# Patient Record
Sex: Female | Born: 1970 | Hispanic: No | Marital: Married | Smoking: Light tobacco smoker
Health system: Southern US, Community
[De-identification: ages and names within clinical notes are randomized; demographics above are authoritative.]

## PROBLEM LIST (undated history)

## (undated) DIAGNOSIS — I739 Peripheral vascular disease, unspecified: Secondary | ICD-10-CM

## (undated) DIAGNOSIS — M1990 Unspecified osteoarthritis, unspecified site: Secondary | ICD-10-CM

## (undated) DIAGNOSIS — R5383 Other fatigue: Secondary | ICD-10-CM

## (undated) DIAGNOSIS — D649 Anemia, unspecified: Secondary | ICD-10-CM

## (undated) DIAGNOSIS — M940 Chondrocostal junction syndrome [Tietze]: Secondary | ICD-10-CM

## (undated) DIAGNOSIS — K219 Gastro-esophageal reflux disease without esophagitis: Secondary | ICD-10-CM

## (undated) DIAGNOSIS — O24419 Gestational diabetes mellitus in pregnancy, unspecified control: Secondary | ICD-10-CM

## (undated) DIAGNOSIS — N644 Mastodynia: Secondary | ICD-10-CM

## (undated) DIAGNOSIS — E785 Hyperlipidemia, unspecified: Secondary | ICD-10-CM

## (undated) DIAGNOSIS — Z720 Tobacco use: Secondary | ICD-10-CM

## (undated) DIAGNOSIS — I10 Essential (primary) hypertension: Secondary | ICD-10-CM

## (undated) HISTORY — DX: Hyperlipidemia, unspecified: E78.5

## (undated) HISTORY — DX: Gestational diabetes mellitus in pregnancy, unspecified control: O24.419

## (undated) HISTORY — DX: Chondrocostal junction syndrome (tietze): M94.0

## (undated) HISTORY — DX: Mastodynia: N64.4

## (undated) HISTORY — DX: Essential (primary) hypertension: I10

## (undated) HISTORY — DX: Gastro-esophageal reflux disease without esophagitis: K21.9

## (undated) HISTORY — DX: Anemia, unspecified: D64.9

## (undated) HISTORY — DX: Unspecified osteoarthritis, unspecified site: M19.90

## (undated) HISTORY — DX: Other fatigue: R53.83

## (undated) HISTORY — DX: Tobacco use: Z72.0

## (undated) HISTORY — DX: Peripheral vascular disease, unspecified: I73.9

---

## 1998-05-13 ENCOUNTER — Other Ambulatory Visit: Admission: RE | Admit: 1998-05-13 | Discharge: 1998-05-13 | Payer: Self-pay | Admitting: *Deleted

## 1998-06-29 HISTORY — PX: TUBAL LIGATION: SHX77

## 1999-06-11 ENCOUNTER — Other Ambulatory Visit: Admission: RE | Admit: 1999-06-11 | Discharge: 1999-06-11 | Payer: Self-pay | Admitting: Obstetrics and Gynecology

## 2000-08-18 ENCOUNTER — Other Ambulatory Visit: Admission: RE | Admit: 2000-08-18 | Discharge: 2000-08-18 | Payer: Self-pay | Admitting: Obstetrics and Gynecology

## 2002-05-23 ENCOUNTER — Other Ambulatory Visit: Admission: RE | Admit: 2002-05-23 | Discharge: 2002-05-23 | Payer: Self-pay | Admitting: Obstetrics and Gynecology

## 2003-05-07 ENCOUNTER — Inpatient Hospital Stay (HOSPITAL_COMMUNITY): Admission: AD | Admit: 2003-05-07 | Discharge: 2003-05-07 | Payer: Self-pay | Admitting: Obstetrics and Gynecology

## 2003-06-07 ENCOUNTER — Inpatient Hospital Stay (HOSPITAL_COMMUNITY): Admission: AD | Admit: 2003-06-07 | Discharge: 2003-06-08 | Payer: Self-pay | Admitting: Obstetrics and Gynecology

## 2003-08-27 ENCOUNTER — Other Ambulatory Visit: Admission: RE | Admit: 2003-08-27 | Discharge: 2003-08-27 | Payer: Self-pay | Admitting: Obstetrics and Gynecology

## 2004-10-01 ENCOUNTER — Other Ambulatory Visit: Admission: RE | Admit: 2004-10-01 | Discharge: 2004-10-01 | Payer: Self-pay | Admitting: Obstetrics and Gynecology

## 2007-06-30 DIAGNOSIS — O24419 Gestational diabetes mellitus in pregnancy, unspecified control: Secondary | ICD-10-CM

## 2007-06-30 DIAGNOSIS — I10 Essential (primary) hypertension: Secondary | ICD-10-CM

## 2007-06-30 HISTORY — DX: Gestational diabetes mellitus in pregnancy, unspecified control: O24.419

## 2007-06-30 HISTORY — PX: LASIK: SHX215

## 2007-06-30 HISTORY — DX: Essential (primary) hypertension: I10

## 2007-11-30 ENCOUNTER — Encounter: Admission: RE | Admit: 2007-11-30 | Discharge: 2007-11-30 | Payer: Self-pay | Admitting: Obstetrics and Gynecology

## 2008-04-06 ENCOUNTER — Inpatient Hospital Stay (HOSPITAL_COMMUNITY): Admission: AD | Admit: 2008-04-06 | Discharge: 2008-04-08 | Payer: Self-pay | Admitting: Obstetrics and Gynecology

## 2008-04-06 ENCOUNTER — Encounter (INDEPENDENT_AMBULATORY_CARE_PROVIDER_SITE_OTHER): Payer: Self-pay | Admitting: Obstetrics and Gynecology

## 2008-05-16 ENCOUNTER — Ambulatory Visit (HOSPITAL_COMMUNITY): Admission: RE | Admit: 2008-05-16 | Discharge: 2008-05-16 | Payer: Self-pay | Admitting: Obstetrics and Gynecology

## 2010-08-02 ENCOUNTER — Emergency Department (HOSPITAL_COMMUNITY): Payer: Self-pay

## 2010-08-02 ENCOUNTER — Emergency Department (HOSPITAL_COMMUNITY)
Admission: EM | Admit: 2010-08-02 | Discharge: 2010-08-02 | Disposition: A | Discharge status: Home / Self Care | Payer: Self-pay | Attending: Emergency Medicine | Admitting: Emergency Medicine

## 2010-08-02 DIAGNOSIS — R209 Unspecified disturbances of skin sensation: Secondary | ICD-10-CM | POA: Insufficient documentation

## 2010-08-02 DIAGNOSIS — R51 Headache: Secondary | ICD-10-CM

## 2010-08-02 DIAGNOSIS — G43909 Migraine, unspecified, not intractable, without status migrainosus: Secondary | ICD-10-CM | POA: Insufficient documentation

## 2010-11-11 NOTE — Op Note (Signed)
NAMESALLE, Krystal Duran               ACCOUNT NO.:  [id number]   MEDICAL RECORD NO.:  [id number]          PATIENT TYPE:  INP   LOCATION:  9168                          FACILITY:  [facility]   PHYSICIAN:  Janine Limbo, M.D.DATE OF BIRTH:  12/16/1971   DATE OF PROCEDURE:  DATE OF DISCHARGE:                               OPERATIVE REPORT   PREOPERATIVE DIAGNOSES:  1. A 38 and 1/[redacted] weeks gestation.  2. Preeclampsia.  3. Polyhydramnios.  4. Suspected macrosomia.  5. Gestational diabetes mellitus currently treated with glyburide.   POSTOPERATIVE DIAGNOSES:  1. A 38 and 1/[redacted] weeks gestation.  2. Preeclampsia.  3. Polyhydramnios.  4. Not suspected macrosomia.  5. Gestational diabetes mellitus currently treated with glyburide.  6. Nuchal cord x2.  7. Shoulder dystocia.  8. Second-degree midline laceration.   PROCEDURES:  1. Normal spontaneous vaginal delivery.  2. Management of shoulder dystocia.  3. Repair of second-degree midline laceration.   OBSTETRICIAN:  Janine Limbo, MD   ANESTHETIC:  Epidural and local 1% Xylocaine.   DISPOSITION:  Krystal Duran is a 40 year old female, gravida 5, para 4-0-0-  0, who presents at 53 and 1/[redacted] weeks gestation.  The patient has been  followed at the [facility] of  [facility].  This pregnancy has been complicated by  the fact that she has gestational diabetes and she is being treated with  glyburide.  An ultrasound has shown polyhydramnios and suspected it  macrosomia.  The patient presents with an elevated blood pressure and  with 447 mg of protein over a 24-hour sample.  She has been diagnosed  with preeclampsia.  She was admitted to the hospital today for induction  of labor.  She was started on magnesium and then started on Pitocin for  induction.  Her membranes were ruptured.  The patient quickly dilated  her cervix and was ready to push for delivery.   FINDINGS:  A 10 pound 3  ounce female infant Financial controller) was delivered from an  occiput anterior presentation.  There were 2 nuchal cords present.  A  shoulder dystocia was encountered.  The Apgars were 5 at 1 minute and 8  at 5 minutes.  The arterial cord blood pH was 7.18.  There was a second-  degree midline laceration.  There were no upper vaginal or cervical  lacerations present.   PROCEDURE:  The patient was placed in a lithotomy position.  She was  draped for delivery.  The patient was able to deliver the fetal head  without difficulty.  The mouth and nose were suctioned.  Two nuchal  cords were present and these were reduced with the head on the perineum.  The patient was allowed to push and the anterior shoulder did not  deliver.  Suprapubic pressure was applied and the patient's hips were  extended to their maximum width.  Again, the anterior shoulder did not  deliver.  We were then able to rotate the fetal shoulder from under the  symphysis and the patient was able to deliver the infant at this point  without difficulty.  The cord was clamped and cut.  The infant was  handed to the pediatric resuscitation team who were present.  An  arterial cord blood pH was obtained.  Routine cord blood studies were  then obtained.  The placenta was removed.  The perineum, upper vagina,  and the cervix were carefully inspected. 5 mL of 1% Xylocaine were  injected into the second-degree midline laceration.  The laceration was  closed using a running locking suture of 2-0 Vicryl.  The infant was  carefully evaluated by the Pediatric Team and grunting was noted.  The  decision was made to transfer the infant to the neonatal intensive care  nursery for close observation.  Krystal Duran was returned to the supine  position.  She will be observed on labor and delivery and then taken to  the adult intensive care unit where she will continue on 2 g of  magnesium sulfate intravenously for total of 24 hours.  The placenta was  sent  to pathology for evaluation.  The estimated blood loss for the  procedure was 400 mL.      Janine Limbo, M.D.  Electronically Signed     AVS/MEDQ  D:  04/06/2008  T:  04/07/2008  Job:  [id number]

## 2010-11-11 NOTE — H&P (Signed)
[name], [name]               ACCOUNT NO.:  [id number]   MEDICAL RECORD NO.:  [id number]          PATIENT TYPE:  MAT   LOCATION:  MATC                          [facility]ILITY:  [facility]   PHYSICIAN:  Janine Limbo, M.D.DATE OF BIRTH:  12/16/1971   DATE OF ADMISSION:  04/06/2008  DATE OF DISCHARGE:                              HISTORY & PHYSICAL   HISTORY OF PRESENT ILLNESS:  [name] is a 40 year old gravida 5,  para 4-0-0-4 at 38-2/7 weeks who presented from the office for blood  pressure evaluation.  Blood pressure in the office today was 110/89 with  positive headache and 1+ protein on a voided specimen with a nonreactive  NST there.  She does report positive visual spots.  She did bring a 24-  hour urine for evaluation.  Cervix has been 3 cm, 80% in the office.   While in maternity admissions unit, the patient was noted to be having  contractions approximately every 3-4 minutes.  Cervix was 3, 75% vertex  and a -2 station.  In light of the patient's elevation of blood  pressure, positive symptomatology, positive urine protein and  contraction activity, the decision was made to admit her for further  labor care and augmentation of labor.  Pregnancy has been remarkable  for:  1. Grand multiparous.  2. Advanced maternal age with amnio declined and all genetic screening      declined.  3. Gestational diabetes with patient on glyburide 5 mg p.o. daily.  4. PIH, no medication required at present.  5. Arcuate uterus.  6. LGA infant.  7. Polyhydramnios.  8. 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   PRENATAL LABORATORIES:  Blood type is O+, Rh antibody negative, VDRL  nonreactive, rubella titer positive, hepatitis B surface antigen  negative, HIV was nonreactive, GC and chlamydia cultures were negative  in March.  Hemoglobin upon entering the practice was 11.8, it was within  normal limits at 28 weeks.  The patient declined first trimester  screening.  She  had an elevated 1-hour Glucola at 19 weeks and then had  an abnormal 3-hour GTT at which time at 19 weeks.  She was diagnosed  with gestational diabetes.  PIH labs were done at 19 weeks secondary to  mild elevation of her blood pressure.  These were negative.  Her CBGs  remained in reasonable control with glyburide up to 10 mg up until the  last week or so when her blood sugars were down.  She had a negative  group B strep culture at 36 weeks.   HISTORY OF PRESENT PREGNANCY:  The patient entered care at approximately  10 weeks by ultrasound at that time, giving an [facility] of April 18, 2008.  She did have a placenta previa noted at that time and a right corpus  luteum cyst.  She was also having issues with nausea.  She was placed on  Zofran.  At 14 weeks, she had a blood pressures 152/80.  No medication  was required at that time.  At 15 weeks, she had a one hour Glucola, it  was elevated, 3-hour GTT was also abnormal, and she received the  diagnosis of gestational diabetes.  She had another ultrasound at 19  weeks, still showing a posterior low-lying placenta.  Cervix was within  normal limits.  Blood pressure at that time was 150/90 and 140/75.  PIH  labs were done at that time that were normal.  Glyburide 5 mg was  started with her diagnosis of gestational diabetes, and then at 25 weeks  was up to 10 mg with dinner.  CBGs were normal.  At 29 weeks, her  ultrasound still showed a low-lying placenta, but weight was at the 93rd  percentile.  NSTs were begun at 31 weeks twice weekly, at 33-1/2 weeks  growth was at greater than 95th percentile, normal fluid.  It was at  85th percentile and BPP was 8/8.  Posterior placenta was still low  lying.  On September 14.  She did have a 4+ glycosuria the cervix at 35  weeks was 1 cm.  On October 6,  blood pressure was 130/94, cervix was 3,  80%.  Ultrasound showed normal placental location of polyhydramnios and  estimated fetal weight of 9 pounds 4  ounces.  She had 1+ protein on a  voided specimen.  The plan was made for 24-hour urine.  PIH labs were  also done that day.  She was then brought back today to return a 24-hour  urine.  Blood pressure was 140/86.  She did complain of a migraine.  She  was also having some questionable spots in her vision.  She was sent to  maternity admissions unit for evaluation, and at that time was admitted  to the hospital for care.  PIH labs at that time on 10/06 showed a  platelet count of 182, SGOT and SGPT of 15 and less than eight with  everything else completely normal.  The rest of her pregnancy has been  followed in the same way.   OBSTETRICAL HISTORY:  In 1993, she had a vaginal delivery of female  infant weight 7 pounds 5 ounces at 40 weeks.  She was in labor 10 hours.  She had no anesthesia.  No complications.  In 1995, she had a vaginal  birth of a female infant weight 7 pounds 11 ounces at 40 weeks.  She was  in labor 4 hours.  She had no complications, and no anesthesia.  In  1997, she had a vaginal birth of a female infant, weight 8 pounds 15  ounces at 40 weeks.  She was in labor 3 hours.  She had no complications  and no anesthesia.  In 2004, she had a vaginal birth of a female infant,  weight 8 pounds 15 ounces at 40 weeks.  She was in labor 2-3 hours.  She  had no complications and no anesthesia during that pregnancy she did  have gestational diabetes diet controlled, and pregnancy induced  hypertension.  She also was noted to have an arcuate or heart shaped  uterus.   MEDICAL HISTORY:  She reports usual childhood illnesses.  She also  reports migraines.   SURGICAL HISTORY:  Lasix eye surgery in 2006.   ALLERGIES:  She has a sensitivity to Motrin which causes stomach pain.   FAMILY HISTORY:  Maternal aunt has heart disease.  Mother and maternal  aunt have hypertension.  Her cousins have asthma.  Her husband who is  her first cousin has non-insulin dependent diabetes.  Maternal  sister  and cousin all  have migraines.  Her cousin had vaginal cancer.  The  patient's only other hospitalizations were for childbirth.  Genetic  history is remarkable the patient's age of 12.  Father of baby's age of  72.  The patient's father of baby are first cousin and the patient's  sister and cousin had twins.   SOCIAL HISTORY:  The patient is married to the father of baby.  He is  involved and supportive.  His name is [facility]  The patient is  Arabic, from [place].  She is of the Muslim faith.  She is high school  educated.  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  Her  husband is college educated.  He is also a Sports administrator.  She has  been followed by the physician service [facility].  She denies  any alcohol, drug or tobacco use during this pregnancy.   PHYSICAL EXAMINATION:  VITAL SIGNS:  Blood pressure is 154/89 and  153/88.  Other vital signs are stable.  The patient is afebrile.  HEENT:  Within normal limits.  LUNGS:  Breath sounds are clear.  HEART:  Regular rate and rhythm without murmur.  BREASTS:  Soft and nontender.  ABDOMEN:  Fundal height is approximately 39-40 cm.  Estimated fetal  weight is 9 pounds.  Uterine contractions every 3-4 minutes, 60 seconds  in duration, mild quality.  NST is reactive with no decelerations, and  there is a negative spontaneous CST and segments.  Cervix is 3, 75%  soft, vertex, -2 station.  Deep tendon reflexes are 1+ without clonus.  There is trace edema noted.   IMPRESSION:  1. Intrauterine pregnancy at 38-2/7 weeks.  2. Pregnancy-induced hypertension.  3. Gestational diabetes.  4. Favorable cervix  5. Group B strep negative.   PLAN:  1. Admit to birthing suite for consult with Dr. Stefano Gaul as attending      physician.  2. Routine physician orders.  3. PIH labs and 24-hour urine results are currently pending.  4. The patient declines pain medication.      Renaldo Reel Emilee Hero,  C.N.M.      Janine Limbo, M.D.  Electronically Signed    VLL/MEDQ  D:  04/06/2008  T:  04/06/2008  Job:  [id number]

## 2010-11-11 NOTE — Op Note (Signed)
Krystal Duran, Krystal Duran               ACCOUNT NO.:  [id number]   MEDICAL RECORD NO.:  [id number]          PATIENT TYPE:  AMB   LOCATION:  SDC                           [facility]   PHYSICIAN:  Hal Morales, M.D.DATE OF BIRTH:  12/16/1971   DATE OF PROCEDURE:  05/16/2008  DATE OF DISCHARGE:                               OPERATIVE REPORT   PREOPERATIVE DIAGNOSIS:  Desire for surgical sterilization.   POSTOPERATIVE DIAGNOSIS:  Desire for surgical sterilization.   OPERATION:  Laparoscopic tubal cautery.   SURGEON:  Vanessa P. Haygood, MD   ANESTHESIA:  General orotracheal.   ESTIMATED BLOOD LOSS:  Less than 10 mL.   COMPLICATIONS:  None.   FINDINGS:  Uterus, tubes, and ovaries appeared within normal limits.   PROCEDURE:  The patient was taken to the operating room after  appropriate identification and placed on the operating table.  After the  attainment of adequate general anesthesia, she was placed in the  modified lithotomy position.  The abdomen, perineum, and vagina were  prepped with multiple layers of Betadine and an in-and-out catheter used  to empty the bladder.  Single-tooth tenaculum was placed on the anterior  cervix.  The abdomen was then draped as a sterile field.  Subumbilical  injection of 0.25% Marcaine for a total of 10 mL was undertaken.  A  subumbilical incision was made and the Veress cannula placed through  that incision into the peritoneal cavity.  Pneumoperitoneum was created  with 3.5 liters of CO2.  Veress cannula was removed and laparoscopic  trocar placed through that incision into the peritoneal cavity.  The  laparoscope was placed through the trocar sleeve.  The above noted  findings were made.  The bipolar cautery was then placed through the  operating channel of the laparoscope.  The right fallopian tube was  identified, followed to its fimbriated end, then grasped at the isthmic  portion and elevated.  The tube was then cauterized in two  adjacent  areas.  A similar procedure was carried out on the opposite side.  Hemostasis was noted to be adequate.  All instruments were then removed  from the peritoneal cavity under direct visualization as CO2 was allowed  to escape.  A fascial suture of 0 Vicryl was placed in a figure-of-eight  fashion.  Subcuticular suture of 3-0 Vicryl was used to close the skin  incision.  Sterile dressing was applied.  The single-tooth tenaculum was  removed and hemostasis achieved with silver nitrate on the cervix.  The  patient was awakened from general anesthesia and taken to the recovery  room in satisfactory condition having tolerated the procedure well with  sponge and instrument counts correct.   DISCHARGE INSTRUCTIONS:  Printed instructions for laparoscopy from the  [facility] of [place].   DISCHARGE MEDICATIONS:  Vicodin 1-2 p.o. q.4 h. p.r.n. pain.   FOLLOWUP:  The patient is to follow up in 2 weeks with Dr. Pennie Rushing.      Hal Morales, M.D.  Electronically Signed     VPH/MEDQ  D:  05/16/2008  T:  05/16/2008  Job:  [id number] 

## 2010-11-14 NOTE — H&P (Signed)
NAMEKYNSIE, FALKNER                           ACCOUNT NO.:  [id number]   MEDICAL RECORD NO.:  [id number]                   PATIENT TYPE:  INP   LOCATION:  9173                                 [facility]   PHYSICIAN:  Naima A. Dillard, M.D.              DATE OF BIRTH:  12/16/1971   DATE OF ADMISSION:  06/07/2003  DATE OF DISCHARGE:                                HISTORY & PHYSICAL   HISTORY:  Ms. Bywater is a 40 year old married white female gravida 3, para  3-0-0-3 at 39-6/7 weeks who presents with questionable leaking clear fluid  since 11 p.m. on December 8.  She reports regular uterine contractions since  that time.  She denies headache, nausea, vomiting, visual disturbances.  Denies bleeding.  Her pregnancy has been followed by the [facility]  OB/GYN service and has been remarkable for history of rapid labor, history  of postpartum depression, migraines, arcuate uterus, prefers female  Raymir Frommelt, group B Strep negative.   PRENATAL LABORATORIES:  Her prenatal laboratories were collected on Nov 15, 2002.  Hemoglobin 12.6, hematocrit 37.1, platelets 268,000.  Blood type O+.  Antibody negative.  Sickle cell trait negative.  RPR nonreactive.  Rubella  immune.  Hepatitis B surface antigen negative.  Pap smear from November 2003  was within normal limits.  Gonorrhea negative.  Chlamydia negative.  Her one-  hour Glucola from March 09, 2003 was 156.  Her RPR at that time was  nonreactive.  Fetal fibronectin was negative.  Her three-hour GTT from  March 20, 2003 was within normal limits at 99, 180, 147, and 123.  Culture of the vaginal tract for group B Strep, gonorrhea, and Chlamydia on  May 07, 2003 were all negative.   HISTORY OF PRESENT PREGNANCY:  She presented for care at [facility] on  Nov 14, 2002 at [redacted] weeks gestation.  Pregnancy ultrasonography at [redacted] weeks  gestation revealed left ovarian cyst, simple.  Second pregnancy  ultrasonography at [redacted] weeks  gestation showed growth consistent with previous  dating and a normal anatomy.  By [redacted] weeks gestation she continued to have  pelvic pressure and fetal fibronectin was collected which was negative.  At  [redacted] weeks gestation she was having some transient blood pressure increases  and was put on a level I bed rest and at [redacted] weeks gestation she had another  ultrasound to evaluate growth which was at the 72nd percentile.  The rest of  her prenatal care was unremarkable.   PAST OBSTETRICAL HISTORY:  She is a gravida 4, para 3-0-0-3.  In August of  1993 she had a vaginal delivery of a female infant weighing 7 pounds 5  ounces at [redacted] weeks gestation after 11 hours in labor.  She had no  complications.  Infant's name was Arabella Merles.  In October of 1995 she had a  vaginal delivery of a female infant weighing 8 pounds 4  ounces at 39+ weeks  gestation after three hours of labor.  There were no complications.  His  name was Husseim.  In July of 1997 she had a vaginal delivery of another  female infant weighing 8 pounds 14 ounces at 39+ weeks gestation after two  hours in labor.  She had no complications.  His name was [place].  She  reports being anemic with her third pregnancy as well as having the baby  blues with each pregnancy.   ALLERGIES:  She has no medication allergies, although reports stomach upset  with Motrin.   PAST MEDICAL HISTORY:  1. She has used oral contraceptives which she stopped in June of 2004.  2. She has an arcuate uterus.  3. She has an occasional yeast infection.  4. She reports having had the usual childhood illnesses.  5. She has a questionable stomach ulcer secondary to Motrin use.  6. She has a history of migraines for which she takes Tylenol.   FAMILY HISTORY:  Remarkable for other family members with migraines and a  cousin with unknown type of cancer.   GENETIC HISTORY:  She has a first cousin with possible anomalies secondary  to questionable polyhydramnios.  Father of the  baby is 86 years old.   SOCIAL HISTORY:  The patient is married to the father of the baby.  His name  is Systems analyst.  They are of the Muslim faith from [place].  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  Father of the baby is college educated and is  employed as an Acupuncturist.  They deny any alcohol, tobacco, or  illicit drug use with the pregnancy.   PHYSICAL EXAMINATION:  VITAL SIGNS:  Stable.  She is afebrile.  HEENT:  Grossly within normal limits.  CHEST:  Clear to auscultation.  HEART:  Regular rate and rhythm.  ABDOMEN:  Gravid in contour with fundal height extending approximately 40 cm  above the pubic symphysis.  Electronic fetal monitoring shows a reactive and  reassuring fetal heart rate with uterine contractions every four minutes.  PELVIC:  Clear fluid is present which is positive for Nitrazine and positive  for ferning.  Cervical examination is 3 cm, 80%, vertex, -2.  EXTREMITIES:  Within normal limits.   ASSESSMENT:  1. Intrauterine pregnancy at term.  2. Spontaneous rupture of membranes.  3. Early labor.   PLAN:  1. Admit to [facility] for consult with Naima A. Dillard, M.D.  2. Routine M.D. orders.  3. The patient declines pain medications for now.  4. Anticipate normal spontaneous vaginal birth.     Cam Hai, C.N.M.                     Naima A. Normand Sloop, M.D.    KS/MEDQ  D:  06/07/2003  T:  06/07/2003  Job:  [id number]

## 2010-11-18 ENCOUNTER — Ambulatory Visit (INDEPENDENT_AMBULATORY_CARE_PROVIDER_SITE_OTHER): Payer: Self-pay | Admitting: Family Medicine

## 2010-11-18 ENCOUNTER — Encounter: Payer: Self-pay | Admitting: Family Medicine

## 2010-11-18 DIAGNOSIS — Z8679 Personal history of other diseases of the circulatory system: Secondary | ICD-10-CM

## 2010-11-18 DIAGNOSIS — R51 Headache: Secondary | ICD-10-CM

## 2010-11-18 DIAGNOSIS — G43829 Menstrual migraine, not intractable, without status migrainosus: Secondary | ICD-10-CM | POA: Insufficient documentation

## 2010-11-18 NOTE — Progress Notes (Signed)
Subjective:    Patient ID: Krystal Duran, female    DOB: 12/16/1971, 40 y.o.   MRN: [id number]  HPI SUBJECTIVE:  Krystal Duran is a 40 y.o. female here to establish primary care. She previously had all or her care provided by an OB/Gyn in town.  She also comes in some specific complaints: 1. Migraine headaches 2. HTN 3. Heavy menstrual periods  We will address ? migaine HA and HTN at this visit.   1.?  Migaines: complains of migraine headache for 10 year(s).   Description of pain: sharp pain, bilateral in the frontal area. She is having headache now. She has a headache most days of the week.  Associated symptoms: fever, light sensitivity, nausea, stiff neck, sweating, visual disturbance, vomiting and chest pain. The chest pain is described as heaviness/tightness located L substernal. Non-radiating. Non-exertional. Usually 4/10. She is having a bit of chest pain now. She denies eye tearing, eye redness, runny nose or nasal congestion.  Patient takes Excedrin for  her headaches without much relief. Was prescribed a medication for migraines but never filled b/c of side effects and cost.   There are no associated abnormal neurological symptoms such as TIA's, loss of balance, loss of vision or speech, numbness or weakness on review. Past neurological history: negative for stroke, MS, epilepsy, or brain tumor.   Symptoms relieved by lying down in dark quiet room. Symptoms are worsened by loud noises, stress (5 kids and student) and menstruation.    Current Outpatient Prescriptions  Medication Sig Dispense Refill  . aspirin-acetaminophen-caffeine (EXCEDRIN MIGRAINE) 250-250-65 MG per tablet Take 1 tablet by mouth every 6 (six) hours as needed.          There are no associated abnormal neurological symptoms such as TIA's, loss of balance, loss of vision or speech, numbness or weakness on review. Past neurological history: negative for stroke, MS, epilepsy, or brain tumor.    2. . ? HTN- Pt  has history of gestational HTN in 2007 and 2009. She feels that her BP is high when she has her headaches and during her menstrual period. She has not documented high  BP.  Hypertension ROS: no TIA's, no chest pain on exertion, no dyspnea on exertion, noting swelling of ankles and no palpitations. BP does consume high salt diet. Does not exercise on a regular basis.   Objective:  BP 128/82  Pulse 85  Temp(Src) 98.4 F (36.9 C) (Oral)  Ht 5\' 6"  (1.676 m)  Wt 187 lb 3.2 oz (84.913 kg)  BMI 30.21 kg/m2  Patient appears non-distressed. Her vitals are normal. Alert and oriented x 3. HEENT: Tender to palpation between eyes over brows and frontal bone.  Ears and throat normal. Also tender at nape of neck otherwise fully supple without nodes and with full ROM.  Sinuses non tender. Cranial nerves are normal.  Fundi are normal with no papilledema, hemorrhages or exudates noted.   Mental status normal.   CV/RESP: BP noted to be well controlled today in office","S1, S2 normal, no gallop, no murmur, chest clear, no JVD, no HSM, no edema. BP does have tenderness on L substernal chest wall.  EXT: no LE edema.   A/P 40 yo F here to establish care.  1. ? Migraines- Pt has many symptoms of migraines/migraine variants (menstrual migraines and cutaneous allodynia). Must also consider tension headaches and rebound headaches given the chronic nature of her symptoms (most days of the week).  I have discussed the pt with Dr. Jennette Kettle. Plan:  have pt keep HA diary for the next month which should include BP readings. Will review headache diary in one month and decided on a intervention on a trial basis.  2. ? HTN- pt is normotensive x 2 reads during this visit. With a reassuring exam. Plan to obtain FLP and BMP to assess for HTN risk factors (hyperlipidemia and hyperglycemia). Counseled pt to decrease salt and increase potassium in her diet by increasing the amount of veggies and fruits she consumes and by halving the amount of  salt she adds to her dishes.  3. Screening- pt due for pap in 3 mos. No history of abnormal pap.     Review of Systems       Objective:   Physical Exam        Assessment & Plan:

## 2010-11-18 NOTE — Patient Instructions (Signed)
Krystal Duran,  Thank you for coming today. It was a pleasure meeting you.  For your headaches- HA diary- time, severity, associated symptoms. Try to record BPs during headaches.   Return for blood draw- for fasting lipid panel and BMP.   F/u in 1 mos with headache diary. The plan is to start an intervention (new medication) at that time.

## 2010-11-18 NOTE — Assessment & Plan Note (Addendum)
?   HTN- pt is normotensive x 2 reads during this visit. With a reassuring exam.Given pt's risk factors (family hx and personal hx of gest htn)  plan to obtain FLP and BMP to assess for additional HTN risk factors (hyperlipidemia and hyperglycemia). Counseled pt to decrease salt and increase potassium in her diet by increasing the amount of veggies and fruits she consumes and by halving the amount of salt she adds to her dishes.

## 2010-11-18 NOTE — Assessment & Plan Note (Signed)
1. ? Migraines- Pt has many symptoms of migraines/migraine variants (menstrual migraines and cutaneous allodynia). Must also consider tension headaches and rebound headaches given the chronic nature of her symptoms (most days of the week).  I have discussed the pt with Dr. Jennette Kettle. Plan: have pt keep HA diary for the next month which should include BP readings. Will review headache diary in one month and decided on a intervention on a trial basis.

## 2010-11-25 ENCOUNTER — Other Ambulatory Visit: Payer: Self-pay

## 2010-11-25 DIAGNOSIS — Z8679 Personal history of other diseases of the circulatory system: Secondary | ICD-10-CM

## 2010-11-25 NOTE — Progress Notes (Signed)
Bmp and flp done today Karagan Lehr 

## 2010-11-26 LAB — LIPID PANEL
HDL: 47 mg/dL (ref >39)
Total CHOL/HDL Ratio: 3.7 Ratio
Triglycerides: 154 mg/dL — ABNORMAL HIGH (ref <150)

## 2010-11-26 LAB — BASIC METABOLIC PANEL: Potassium: 4.4 mEq/L (ref 3.5–5.3)

## 2010-12-19 ENCOUNTER — Ambulatory Visit (INDEPENDENT_AMBULATORY_CARE_PROVIDER_SITE_OTHER): Payer: Self-pay | Admitting: Family Medicine

## 2010-12-19 ENCOUNTER — Encounter: Payer: Self-pay | Admitting: Family Medicine

## 2010-12-19 VITALS — BP 126/81 | HR 92 | Temp 98.1°F | Wt 190.0 lb

## 2010-12-19 DIAGNOSIS — R5383 Other fatigue: Secondary | ICD-10-CM

## 2010-12-19 DIAGNOSIS — R5381 Other malaise: Secondary | ICD-10-CM

## 2010-12-19 DIAGNOSIS — Z8679 Personal history of other diseases of the circulatory system: Secondary | ICD-10-CM

## 2010-12-19 DIAGNOSIS — R51 Headache: Secondary | ICD-10-CM

## 2010-12-19 DIAGNOSIS — R059 Cough, unspecified: Secondary | ICD-10-CM | POA: Insufficient documentation

## 2010-12-19 DIAGNOSIS — N644 Mastodynia: Secondary | ICD-10-CM | POA: Insufficient documentation

## 2010-12-19 DIAGNOSIS — R05 Cough: Secondary | ICD-10-CM

## 2010-12-19 HISTORY — DX: Mastodynia: N64.4

## 2010-12-19 HISTORY — DX: Other fatigue: R53.83

## 2010-12-19 LAB — CBC
Hemoglobin: 11.4 g/dL — ABNORMAL LOW (ref 12.0–15.0)
MCHC: 31.7 g/dL (ref 30.0–36.0)
RBC: 4.63 MIL/uL (ref 3.87–5.11)
WBC: 9.6 10*3/uL (ref 4.0–10.5)

## 2010-12-19 MED ORDER — BENZONATATE 100 MG PO CAPS: 100.0000 mg | ORAL_CAPSULE | Freq: Four times a day (QID) | ORAL | Status: DC | PRN

## 2010-12-19 MED ORDER — SUMATRIPTAN SUCCINATE 50 MG PO TABS: 50.0000 mg | ORAL_TABLET | Freq: Once | ORAL | Status: DC | PRN
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
## 2010-12-19 NOTE — Patient Instructions (Addendum)
Mrs. Newcomer,  Thank you for coming in today.  For cough-likely viral illness that will just need to run its course. Please take the Tessalon for the cough. For the headaches-my impression is menstrual migraine. Please take the Imitrex use it 2-3 days before the onset of your period and as needed.  For your breast pain- your exam is normal. My impression is soft tissue damage. Please use warm compress for relief. Call the clinic once you have your orange card and we will set up your first mammogram.   -Dr. Armen Pickup     What is this medicine? SUMATRIPTAN (soo ma TRIP tan) is used to treat migraines with or without aura. An aura is a strange feeling or visual disturbance that warns you of an attack. It is not used to prevent migraines. This medicine may be used for other purposes; ask your health care provider or pharmacist if you have questions.   What should I tell my health care provider before I take this medicine? They need to know if you have any of these conditions: -bowel disease or colitis -diabetes -family history of heart disease -fast or irregular heart beat -heart or blood vessel disease, angina (chest pain), or previous heart attack -high blood pressure -high cholesterol -history of stroke, transient ischemic attacks (TIAs or mini-strokes), or intracranial bleeding -kidney or liver disease -overweight -poor circulation -postmenopausal or surgical removal of uterus and ovaries -Raynaud's disease -seizure disorder -an unusual or allergic reaction to sumatriptan, other medicines, foods, dyes, or preservatives -pregnant or trying to get pregnant -breast-feeding   How should I use this medicine? Take this medicine by mouth with a glass of water. Follow the directions on the prescription label. This medicine is taken at the first symptoms of a migraine. It is not for everyday use. If your migraine headache returns after one dose, you can take another dose as directed. You  must leave at least 2 hours between doses, and do not take more than 100 mg as a single dose. Do not take more than 200 mg total in any 24 hour period. If there is no improvement at all after the first dose, do not take a second dose without talking to your doctor or health care professional. Do not take your medicine more often than directed.   Talk to your pediatrician regarding the use of this medicine in children. Special care may be needed.   Overdosage: If you think you have taken too much of this medicine contact a poison control center or emergency room at once. NOTE: This medicine is only for you. Do not share this medicine with others.   What if I miss a dose? This does not apply; this medicine is not for regular use.   What may interact with this medicine? Do not take this medicine with any of the following medicines: -amphetamine or cocaine -dihydroergotamine, ergotamine, ergoloid mesylates, methysergide, or ergot-type medication - do not take within 24 hours of taking sumatriptan -feverfew -MAOIs like Carbex, Eldepryl, Marplan, Nardil, and Parnate - do not take sumatriptan within 2 weeks of stopping MAOI therapy -other migraine medicines like almotriptan, eletriptan, naratriptan, rizatriptan, zolmitriptan - do not take within 24 hours of taking sumatriptan -tryptophan   This medicine may also interact with the following medications: -lithium -medicines for mental depression, anxiety or mood problems -medicines for weight loss such as dexfenfluramine, dextroamphetamine, fenfluramine, or sibutramine -St. John's wort   This list may not describe all possible interactions. Give your health care provider  a list of all the medicines, herbs, non-prescription drugs, or dietary supplements you use. Also tell them if you smoke, drink alcohol, or use illegal drugs. Some items may interact with your medicine.   What should I watch for while using this medicine? Only take this medicine  for a migraine headache. Take it if you get warning symptoms or at the start of a migraine attack. It is not for regular use to prevent migraine attacks.   You may get drowsy or dizzy. Do not drive, use machinery, or do anything that needs mental alertness until you know how this medicine affects you. To reduce dizzy or fainting spells, do not sit or stand up quickly, especially if you are an older patient. Alcohol can increase drowsiness, dizziness and flushing. Avoid alcoholic drinks.   Smoking cigarettes may increase the risk of heart-related side effects from using this medicine.   What side effects may I notice from receiving this medicine? Side effects that you should report to your doctor or health care professional as soon as possible: -allergic reactions like skin rash, itching or hives, swelling of the face, lips, or tongue -breathing problems -changes in vision -chest or throat pain, tightness -fast, slow, or irregular heart beat -hallucinations -increased or decreased blood pressure -problems with balance, talking, walking -seizures -severe stomach pain and cramping, bloody diarrhea -tingling, pain, or numbness in the face, hands or feet   Side effects that usually do not require medical attention (report to your doctor or health care professional if they continue or are bothersome): -drowsiness -feeling warm, flushing, or redness of the face -muscle pain or cramps -nausea, vomiting, diarrhea or stomach upset -weak or tired   This list may not describe all possible side effects. Call your doctor for medical advice about side effects. You may report side effects to FDA at 1-800-FDA-1088.   Where should I keep my medicine? Keep out of the reach of children.   Store at room temperature between 2 and 30 degrees C (36 and 86 degrees F). Throw away any unused medicine after the expiration date.   NOTE:This sheet is a summary. It may not cover all possible information. If you  have questions about this medicine, talk to your doctor, pharmacist, or health care provider.      2011, [employer] Standard.

## 2010-12-19 NOTE — Assessment & Plan Note (Signed)
Menstrual migraine likely given onset of symptoms in close relation to menstrual cycles. Will try Imitrex to start 2-3 days prior to menses to decrease severity of symptoms.
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
## 2010-12-19 NOTE — Assessment & Plan Note (Signed)
Likely viral in etiology. Plan conservative management. No red flags.

## 2010-12-19 NOTE — Assessment & Plan Note (Signed)
BP normal again this visit. Will continue to follow. Pt with normal Cr,   LDL and random blood glucose on recent blood work.

## 2010-12-19 NOTE — Assessment & Plan Note (Signed)
CBC to rule out anemia. Consider TSH if CBC is normal to rule out thyroid dysfunction.

## 2011-03-30 LAB — CREATININE CLEARANCE, URINE, 24 HOUR
Creatinine, 24H Ur: 1558
Creatinine: 0.5

## 2011-03-30 LAB — CBC
Hemoglobin: 11.7 — ABNORMAL LOW
Hemoglobin: 9.3 — ABNORMAL LOW
MCHC: 33.3
Platelets: 212
RBC: 4.35
RDW: 15.4
WBC: 11.3 — ABNORMAL HIGH
WBC: 6.9

## 2011-03-30 LAB — PROTEIN, URINE, 24 HOUR
Protein, 24H Urine: 447 — ABNORMAL HIGH
Protein, Urine: 38
Urine Total Volume-UPROT: 1175

## 2011-03-30 LAB — MAGNESIUM: Magnesium: 5.5 — ABNORMAL HIGH

## 2011-03-30 LAB — GLUCOSE, CAPILLARY
Glucose-Capillary: 134 — ABNORMAL HIGH
Glucose-Capillary: 177 — ABNORMAL HIGH
Glucose-Capillary: 75
Glucose-Capillary: 84
Glucose-Capillary: 93

## 2011-03-30 LAB — COMPREHENSIVE METABOLIC PANEL
ALT: 11
AST: 20
AST: 22
Albumin: 1.9 — ABNORMAL LOW
Alkaline Phosphatase: 166 — ABNORMAL HIGH
BUN: 6
CO2: 21
Chloride: 101
Creatinine, Ser: 0.5
GFR calc Af Amer: >60
GFR calc Af Amer: >60
GFR calc non Af Amer: >60
GFR calc non Af Amer: >60
Glucose, Bld: 89
Potassium: 4.2
Sodium: 134 — ABNORMAL LOW
Total Bilirubin: 0.5
Total Protein: 6.3

## 2011-03-31 LAB — CBC
Hemoglobin: 12.4
Platelets: 361
RDW: 15.3

## 2011-03-31 LAB — PREGNANCY, URINE: Preg Test, Ur: NEGATIVE

## 2011-06-18 ENCOUNTER — Encounter: Payer: Self-pay | Admitting: Family Medicine

## 2011-06-18 ENCOUNTER — Ambulatory Visit (INDEPENDENT_AMBULATORY_CARE_PROVIDER_SITE_OTHER): Payer: Self-pay | Admitting: Family Medicine

## 2011-06-18 ENCOUNTER — Other Ambulatory Visit: Payer: Self-pay | Admitting: *Deleted

## 2011-06-18 DIAGNOSIS — N6320 Unspecified lump in the left breast, unspecified quadrant: Secondary | ICD-10-CM

## 2011-06-18 DIAGNOSIS — N644 Mastodynia: Secondary | ICD-10-CM

## 2011-06-18 DIAGNOSIS — R51 Headache: Secondary | ICD-10-CM

## 2011-06-18 DIAGNOSIS — R05 Cough: Secondary | ICD-10-CM

## 2011-06-18 MED ORDER — BENZONATATE 100 MG PO CAPS: 100.0000 mg | ORAL_CAPSULE | Freq: Four times a day (QID) | ORAL | Status: DC | PRN

## 2011-06-18 NOTE — Assessment & Plan Note (Signed)
Viral URI. Gave Mucinex instructions to patient wall cough is productive. Tessalon Perles if cough continues as a dry cough
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
## 2011-06-18 NOTE — Assessment & Plan Note (Signed)
Breast pain at 9:00 now continuous since June and getting worse. Will send for ultrasound of that area. This may represent a cyst or other lesion.

## 2011-06-18 NOTE — Patient Instructions (Signed)
Please go for your mammogram and ultrasound. They should be able to tell you the results while you're there. For your cough-please go and take some Mucinex, this will help bring up the mucus I have also given you a prescription for Tessalon Perles once the cough is nonproductive.

## 2011-07-06 ENCOUNTER — Ambulatory Visit
Admission: RE | Admit: 2011-07-06 | Discharge: 2011-07-06 | Disposition: A | Discharge status: Home / Self Care | Payer: Self-pay | Source: Ambulatory Visit | Attending: *Deleted | Admitting: *Deleted

## 2011-07-06 DIAGNOSIS — N6320 Unspecified lump in the left breast, unspecified quadrant: Secondary | ICD-10-CM

## 2011-07-13 ENCOUNTER — Encounter: Payer: Self-pay | Admitting: Family Medicine

## 2011-07-13 ENCOUNTER — Ambulatory Visit (INDEPENDENT_AMBULATORY_CARE_PROVIDER_SITE_OTHER): Payer: Self-pay | Admitting: Family Medicine

## 2011-07-13 VITALS — BP 119/82 | HR 94 | Temp 98.5°F | Ht 66.0 in | Wt 202.0 lb

## 2011-07-13 DIAGNOSIS — J029 Acute pharyngitis, unspecified: Secondary | ICD-10-CM

## 2011-07-13 DIAGNOSIS — R05 Cough: Secondary | ICD-10-CM

## 2011-07-13 LAB — POCT RAPID STREP A (OFFICE): Rapid Strep A Screen: NEGATIVE

## 2011-07-13 MED ORDER — GUAIFENESIN-CODEINE 100-10 MG/5ML PO SYRP: 5.0000 mL | ORAL_SOLUTION | Freq: Three times a day (TID) | ORAL | Status: AC | PRN

## 2011-07-13 NOTE — Assessment & Plan Note (Signed)
A: Agree with viral URI. Possibly influenza but less likely given time course. Recent symptoms x1 week and outside of the window for Tamiflu. Rapid strep negative. Strep unlikely given cough.  P: education provided, reassurance, rest, supportive care, robitussin AC, reviewed red flags to prompt return. F/u in 1 week if not getting better.

## 2011-07-13 NOTE — Progress Notes (Signed)
  Subjective:    Patient ID: Krystal Duran, female    DOB: 12/16/1971, 41 y.o.   MRN: [id number]  HPI CC: cough HPI: cough now for 3 weeks. The cough is non-productive. The cough went away for 1 week after taking OTC cough medicine, but returned 1 week ago. It is associated with clear runny nose, mild sore throat, fever to 102 F  yesterday, and aches (shoulders and feet) x 2 days. She has no sick contacts at home. 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 She did not get her flu shot this season. She is drinking well, appetite a little low. No SOB, no CP, no rash.    Review of Systems Pertinent ROS as per HPI.     Objective:   Physical Exam Filed Vitals:   07/13/11 1342  BP: 119/82  Pulse: 94  Temp: 98.5 F (36.9 C)  GEN: alert, no acute distress HEENT: NCAT, MMM, Oropharynx with 2+ tonsillar swelling no exudate. No cervical lymphadenopathy. CV: s1s2 rrr Resp: nml WOB, CTA b/l Skin: warm and dry, brisk capillary refill. No rashes.    Labs: rapid strep negative.      Assessment & Plan:

## 2011-07-13 NOTE — Patient Instructions (Signed)
Krystal Duran,  Thank you for coming in today. Please take the cough medicine with codeine for cough and pain. Please also drink fluids and rest as much as possible. It may take another 2-3 weeks for you to get over your cough. If you notice: shortness of breath, high fever that stay up despite tylenol, or nasty sputum call and come back for re-evaluation.  -Dr. Armen Pickup

## 2011-09-11 ENCOUNTER — Ambulatory Visit (INDEPENDENT_AMBULATORY_CARE_PROVIDER_SITE_OTHER): Payer: Self-pay | Admitting: Emergency Medicine

## 2011-09-11 ENCOUNTER — Encounter: Payer: Self-pay | Admitting: Emergency Medicine

## 2011-09-11 VITALS — BP 140/89 | HR 86 | Temp 98.6°F | Ht 66.0 in | Wt 203.3 lb

## 2011-09-11 DIAGNOSIS — R05 Cough: Secondary | ICD-10-CM

## 2011-09-11 DIAGNOSIS — M940 Chondrocostal junction syndrome [Tietze]: Secondary | ICD-10-CM

## 2011-09-11 HISTORY — DX: Chondrocostal junction syndrome (tietze): M94.0

## 2011-09-11 MED ORDER — LORATADINE 10 MG PO TABS: 10.0000 mg | ORAL_TABLET | Freq: Every day | ORAL | Status: DC

## 2011-09-11 NOTE — Assessment & Plan Note (Signed)
Likely secondary to cough.  Allergic to motrin; will have her take tylenol 500mg  TID for the next several days to try and alleviate pain.

## 2011-09-11 NOTE — Assessment & Plan Note (Addendum)
Likely recurrent viral infections.  Will give trial of loratadine for any allergic component.  Discussed expectation that cough should improve in next 1-2 months.  Patient to follow up with PCP for chronic cough evaluation if cough not improving.

## 2011-09-11 NOTE — Patient Instructions (Signed)
I'm sorry you have been such a hard time with the cough.  What is likely happening, is you are getting repeated viral infection that are causing the cough.  There might also be an allergic component.  Please continue taking the cough syrup if it helps the cough.  We are going to start loratadine (this is an allergy medicine).  Please take it once a day for the next month.  The most common side effect is feeling sleepy, if this occurs, take it at bedtime.  The chest pain is from your cough.  Please take tylenol 500mg  three times a day for the next 3-5 days then as needed.  Follow up with Dr. Armen Pickup in 1-2 months for a pap smear and to follow up the cough.

## 2011-09-11 NOTE — Progress Notes (Signed)
  Subjective:    Patient ID: Krystal Duran, female    DOB: 12/16/1971, 41 y.o.   MRN: [id number]  HPI Krystal Duran is here today for a SDA for cough.  She has had a cough that comes and goes for the last 6 months.  Initially told it was viral.  Has had times where it improves, but then will worsen again.  Has had intermittent nasal congestion and subjective fevers during this time as well.  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  No current nasal symptoms, cough is non-productive, no SOB.  Has developed some right sided chest pain that is worse with movement, deep breath, palpation, and cough.  Also has a similar spot on left chest underneath her breast.  Occasionally has some itchy eyes associated with headaches and some nausea.  No heartburn.  She is a non-smoker and has no PMHx other than migraines.   Review of Systems See HPI    Objective:   Physical Exam BP 140/89  Pulse 86  Temp(Src) 98.6 F (37 C) (Oral)  Ht 5\' 6"  (1.676 m)  Wt 203 lb 4.8 oz (92.216 kg)  BMI 32.81 kg/m2  LMP 08/20/2011 Gen: alert, cooperative, NAD, dry cough multiple times while in exam room HEENT: AT/[place], sclera white, no nasal discharge, nasal mucosa normal, no pharyngeal erythema or swelling, no post-nasal drainage appreciated Neck: no LAD, supple CV: RRR, no murmurs Chest: CTAB, no wheezes, rale, rhonchi; chest wall tender to palpation along right costo-chondral margin and the intercostal space under the left lateral breast      Assessment & Plan:

## 2011-10-30 ENCOUNTER — Ambulatory Visit (INDEPENDENT_AMBULATORY_CARE_PROVIDER_SITE_OTHER): Payer: Self-pay | Admitting: Family Medicine

## 2011-10-30 ENCOUNTER — Encounter: Payer: Self-pay | Admitting: Family Medicine

## 2011-10-30 VITALS — BP 134/90 | HR 111 | Temp 99.2°F | Ht 66.0 in | Wt 204.7 lb

## 2011-10-30 DIAGNOSIS — R7309 Other abnormal glucose: Secondary | ICD-10-CM

## 2011-10-30 DIAGNOSIS — R110 Nausea: Secondary | ICD-10-CM

## 2011-10-30 DIAGNOSIS — R739 Hyperglycemia, unspecified: Secondary | ICD-10-CM

## 2011-10-30 DIAGNOSIS — E1165 Type 2 diabetes mellitus with hyperglycemia: Secondary | ICD-10-CM | POA: Insufficient documentation

## 2011-10-30 DIAGNOSIS — E119 Type 2 diabetes mellitus without complications: Secondary | ICD-10-CM

## 2011-10-30 LAB — POCT HEMOGLOBIN: Hemoglobin: 11.3 g/dL — AB (ref 12.2–16.2)

## 2011-10-30 MED ORDER — METFORMIN HCL ER 500 MG PO TB24: 500.0000 mg | ORAL_TABLET | Freq: Every day | ORAL | Status: DC

## 2011-10-30 NOTE — Assessment & Plan Note (Signed)
New dx today- random glucose > 400, subsequent a1c 9.5.  This explains her symptoms of dry mouth, frequent urination, nausea.  Will start on metformin ER, titrate up weekly to 2000 mg daily.  She declines need for nausea medication.  Discussed medicine itself may cause nausea.  Advised increased water intake, gave info to start with lifestyle modification. Declines need for intro diabetes course- had course for gestation DM 3 years ago and her husband had DM as well.  May consider nutrion referral if needs additional helpin future.  Will follow-up with PCP in 2-4 weeks for follow-up DM care, discuss need for further meds, risk factor screening, glucometer.

## 2011-10-30 NOTE — Progress Notes (Signed)
Addended by: Macy Mis on: 10/30/2011 05:25 PM   Modules accepted: Orders

## 2011-10-30 NOTE — Patient Instructions (Signed)
Start new medicine- Metformin 500 mg (one tab) once daily for 1 week Then 1000 mg (2 tabs) for 1 week Then 1500 ( 3 tabs) for 1 week Then 2000 ( 4 tabs) for one week  Diabetes, Type 2 Diabetes is a long-lasting (chronic) disease. In type 2 diabetes, the pancreas does not make enough insulin (a hormone), and the body does not respond normally to the insulin that is made. This type of diabetes was also previously called adult-onset diabetes. It usually occurs after the age of 34, but it can occur at any age.   CAUSES   Type 2 diabetes happens because the pancreas is not making enough insulin or your body has trouble using the insulin that your pancreas does make properly. SYMPTOMS    Drinking more than usual.   Urinating more than usual.   Blurred vision.   Dry, itchy skin.   Frequent infections.   Feeling more tired than usual (fatigue).  DIAGNOSIS The diagnosis of type 2 diabetes is usually made by one of the following tests:  Fasting blood glucose test. You will not eat for at least 8 hours and then take a blood test.   Random blood glucose test. Your blood glucose (sugar) is checked at any time of the day regardless of when you ate.   Oral glucose tolerance test (OGTT). Your blood glucose is measured after you have not eaten (fasted) and then after you drink a glucose containing beverage.  TREATMENT    Healthy eating.   Exercise.   Medicine, if needed.   Monitoring blood glucose.   Seeing your caregiver regularly.  HOME CARE INSTRUCTIONS    Check your blood glucose at least once a day. More frequent monitoring may be necessary, depending on your medicines and on how well your diabetes is controlled. Your caregiver will advise you.   Take your medicine as directed by your caregiver.   Do not smoke.   Make wise food choices. Ask your caregiver for information. Weight loss can improve your diabetes.   Learn about low blood glucose (hypoglycemia) and how to treat it.    Get your eyes checked regularly.   Have a yearly physical exam. Have your blood pressure checked and your blood and urine tested.   Wear a pendant or bracelet saying that you have diabetes.   Check your feet every night for cuts, sores, blisters, and redness. Let your caregiver know if you have any problems.  SEEK MEDICAL CARE IF:    You have problems keeping your blood glucose in target range.   You have problems with your medicines.   You have symptoms of an illness that do not improve after 24 hours.   You have a sore or wound that is not healing.   You notice a change in vision or a new problem with your vision.   You have a fever.  MAKE SURE YOU:  Understand these instructions.   Will watch your condition.   Will get help right away if you are not doing well or get worse.  Document Released: 06/15/2005 Document Revised: 06/04/2011 Document Reviewed: 12/01/2010 [facility] Patient Information 2012 [place], [place].

## 2011-10-30 NOTE — Progress Notes (Signed)
  Subjective:    Patient ID: Krystal Duran, female    DOB: 12/16/1971, 41 y.o.   MRN: [id number]  HPI  Nausea and dry mouth for 2-3 weeks.  Took a pink OTC tablet for it, not improved.  Interrupts eating because she feels so nauseous.  No sour taste in mouth.  No dry eyes.  Feels thirsty.  Increased urinary frequency.  Eating a lot of ice.  No increased salivation.  Feels constant pain in stomach "rubbing"- cannot point to the area- says she just "knows".  BM twice per day which is a little more than usual for her.  No change in BM.   Worse when laying on on either side.    Taking "pink medicine" for nausea.  No other OTC medicines.  Stopped takign claritin.   She has tried cutting back on dairy products.  I have reviewed patient's  PMH, FH, and Social history and Medications as related to this visit. Sig for gestational diabetes.   Review of Systems No fever, chills, weight loss.      Objective:   Physical Exam GEN: Alert & Oriented, No acute distress HEENT: [place]/AT. EOMI, PERRLA, no conjunctival injection or scleral icterus.  Bilateral tympanic membranes intact without erythema or effusion.  .  Nares without edema or rhinorrhea. Oral mucosa tacky. Oropharynx is without erythema or exudates.  No anterior or posterior cervical lymphadenopathy. CV:  Regular Rate & Rhythm, no murmur Respiratory:  Normal work of breathing, CTAB Abd:  + BS, soft, no tenderness to palpation Ext: no pre-tibial edema        Assessment & Plan:

## 2011-11-18 ENCOUNTER — Ambulatory Visit (INDEPENDENT_AMBULATORY_CARE_PROVIDER_SITE_OTHER): Payer: Self-pay | Admitting: Family Medicine

## 2011-11-18 ENCOUNTER — Encounter: Payer: Self-pay | Admitting: Family Medicine

## 2011-11-18 VITALS — BP 127/87 | HR 83 | Temp 97.8°F | Ht 66.0 in | Wt 200.9 lb

## 2011-11-18 DIAGNOSIS — E119 Type 2 diabetes mellitus without complications: Secondary | ICD-10-CM

## 2011-11-18 DIAGNOSIS — E1165 Type 2 diabetes mellitus with hyperglycemia: Secondary | ICD-10-CM

## 2011-11-18 LAB — LIPID PANEL
Cholesterol: 183 mg/dL (ref 0–200)
HDL: 45 mg/dL (ref >39)
Total CHOL/HDL Ratio: 4.1 Ratio

## 2011-11-18 LAB — COMPREHENSIVE METABOLIC PANEL
BUN: 16 mg/dL (ref 6–23)
CO2: 23 mEq/L (ref 19–32)
Calcium: 9.9 mg/dL (ref 8.4–10.5)
Chloride: 98 mEq/L (ref 96–112)
Creat: 0.55 mg/dL (ref 0.50–1.10)

## 2011-11-18 MED ORDER — METFORMIN HCL 1000 MG PO TABS: 1000.0000 mg | ORAL_TABLET | Freq: Two times a day (BID) | ORAL | Status: DC

## 2011-11-18 MED ORDER — METFORMIN HCL ER (OSM) 1000 MG PO TB24: 1000.0000 mg | ORAL_TABLET | Freq: Two times a day (BID) | ORAL | Status: DC

## 2011-11-18 MED ORDER — SITAGLIPTIN PHOSPHATE 100 MG PO TABS: 100.0000 mg | ORAL_TABLET | Freq: Every day | ORAL | Status: DC

## 2011-11-18 NOTE — Patient Instructions (Addendum)
Rosalee,  Thank you for coming to see me today. For your diabetes please do the following:   -increase metformin to 1000 mg twice daily -start januvia  100 mg daily.  For your diet:  1. Make sure to eat breakfast, lunch and dinner (also may add one snack mid morning or mid afternoon).  2. Carbs: no more than 2 servings (30 gram/2oz) per meal and 1 serving per snack.  3. Exercise such that you sweat some and your heart rate goes up most days of the weak.  4. Water, water, water  5. Check blood sugar 2-3 x per week to get a feel for how different foods effect your blood sugar:  Goal fasting <100  Goal after eating < 200  I will call if your blood work is abnormal or send a letter if normal.  F/u in 2 weeks  with blood sugar logs   Dr. Armen Pickup

## 2011-11-18 NOTE — Assessment & Plan Note (Addendum)
A: under improving control. Med: compliant with metformin taper, no lows P:  As per AVS  -increase metformin to 1000 mg twice daily -start januvia  100 mg daily. -Labs: lipid panel and CMET -ophthalmology referreral   For your diet:  1. Make sure to eat breakfast, lunch and dinner (also may add one snack mid morning or mid afternoon).  2. Carbs: no more than 2 servings (30 gram/2oz) per meal and 1 serving per snack.  3. Exercise such that you sweat some and your heart rate goes up most days of the weak.  4. Water, water, water  5. Check blood sugar 2-3 x per week to get a feel for how different foods effect your blood sugar:  Goal fasting <100  Goal after eating < 200  F/u in 2 weeks.

## 2011-11-18 NOTE — Progress Notes (Signed)
Patient ID: Krystal Duran, female   DOB: 12/16/1971, 41 y.o.   MRN: [id number] Subjective:     Krystal Duran is an 41 y.o. female who presents for follow up of diabetes. Current symptoms include: hyperglycemia checking CBGs in AM while fasting: 350s. , nausea, visual disturbances and diffuse mild abdominal pain. . Patient denies foot ulcerations, hypoglycemia , increased appetite, paresthesia of the feet and vomiting. Evaluation to date has included: fasting blood sugar and hemoglobin A1C. Home sugars: BGs are high in the morning. Current treatments: patient compliant with metfromin, now taking 500 mg TID. Marland Kitchen Last dilated eye exam never.   Reviewed history: husband with diabetes x 8 years, has history of gestational diabetes.   Review of Systems Pertinent items are noted in HPI.    Objective:    BP 127/87  Pulse 83  Temp(Src) 97.8 F (36.6 C) (Oral)  Ht 5\' 6"  (1.676 m)  Wt 200 lb 14.4 oz (91.128 kg)  BMI 32.43 kg/m2 General appearance: alert, cooperative and no distress Eyes: conjunctivae/corneas clear. PERRL, EOM's intact.  Throat: lips, mucosa, and tongue normal; teeth and gums normal Abdomen: soft, non-tender; bowel sounds normal; no masses,  no organomegaly Neurologic: Grossly normal  Laboratory:  Lab Results  Component Value Date   HGBA1C 9.5 10/30/2011     Assessment:    Diabetes mellitus Type II, under improving control.    Plan:    As per AVS  -increase metformin to 1000 mg twice daily -start januvia  100 mg daily.  For your diet:  1. Make sure to eat breakfast, lunch and dinner (also may add one snack mid morning or mid afternoon).  2. Carbs: no more than 2 servings (30 gram/2oz) per meal and 1 serving per snack.  3. Exercise such that you sweat some and your heart rate goes up most days of the weak.  4. Water, water, water  5. Check blood sugar 2-3 x per week to get a feel for how different foods effect your blood sugar:  Goal fasting <100  Goal after eating <  200  F/u in 2 weeks.

## 2011-12-04 ENCOUNTER — Other Ambulatory Visit (HOSPITAL_COMMUNITY)
Admission: RE | Admit: 2011-12-04 | Discharge: 2011-12-04 | Disposition: A | Discharge status: Home / Self Care | Payer: Self-pay | Source: Ambulatory Visit | Attending: Family Medicine | Admitting: Family Medicine

## 2011-12-04 ENCOUNTER — Ambulatory Visit (INDEPENDENT_AMBULATORY_CARE_PROVIDER_SITE_OTHER): Payer: Self-pay | Admitting: Family Medicine

## 2011-12-04 ENCOUNTER — Encounter: Payer: Self-pay | Admitting: Family Medicine

## 2011-12-04 VITALS — BP 131/85 | HR 98 | Temp 98.4°F | Ht 66.0 in | Wt 202.0 lb

## 2011-12-04 DIAGNOSIS — R110 Nausea: Secondary | ICD-10-CM

## 2011-12-04 DIAGNOSIS — Z01419 Encounter for gynecological examination (general) (routine) without abnormal findings: Secondary | ICD-10-CM | POA: Insufficient documentation

## 2011-12-04 DIAGNOSIS — K7581 Nonalcoholic steatohepatitis (NASH): Secondary | ICD-10-CM | POA: Insufficient documentation

## 2011-12-04 DIAGNOSIS — E1165 Type 2 diabetes mellitus with hyperglycemia: Secondary | ICD-10-CM

## 2011-12-04 DIAGNOSIS — Z124 Encounter for screening for malignant neoplasm of cervix: Secondary | ICD-10-CM

## 2011-12-04 DIAGNOSIS — K7689 Other specified diseases of liver: Secondary | ICD-10-CM

## 2011-12-04 DIAGNOSIS — Z0000 Encounter for general adult medical examination without abnormal findings: Secondary | ICD-10-CM

## 2011-12-04 LAB — HEPATIC FUNCTION PANEL
ALT: 95 U/L — ABNORMAL HIGH (ref 0–35)
AST: 120 U/L — ABNORMAL HIGH (ref 0–37)
Albumin: 4.1 g/dL (ref 3.5–5.2)

## 2011-12-04 LAB — LIPASE: Lipase: 25 U/L (ref 0–75)

## 2011-12-04 LAB — CBC
HCT: 37.3 % (ref 36.0–46.0)
MCHC: 31.1 g/dL (ref 30.0–36.0)
RDW: 13.7 % (ref 11.5–15.5)

## 2011-12-04 LAB — GLUCOSE, CAPILLARY: Glucose-Capillary: 158 mg/dL — ABNORMAL HIGH (ref 70–99)

## 2011-12-04 NOTE — Assessment & Plan Note (Addendum)
A: slightly elevated LFTS in the setting of elevated TGs and new onset DM. 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 P: Recheck LFTs Also Check CBC and lipase.  Consider abdominal ultrasound if GI upset persist despite treating diabetes as biliary colic is also a possibility.

## 2011-12-04 NOTE — Progress Notes (Signed)
Subjective:     Patient ID: Krystal Duran, female   DOB: 12/16/1971, 41 y.o.   MRN: [id number]  HPI 41 yo F patient presents for physical and to f/u diabetes: 1. Well woman: had mammogram earlier in the year. Last pap smear many years ago. No history of abnormal pap smears. Sexually active with her husband. No vaginal discharge, vaginal lesions or pain with intercourse.  2. DM: taking metformin and januvia as prescribed. Checking blood sugar at home. Reports elevated fasting blood sugar in the 200s. Reports blurred vision in the AM and at night. nausea 6 days per week. Denies vomiting. Some abdominal discomfort. Denies abdominal pain, diarrhea and increased appetite. Reports that she is compliant with a low carb diet.  3. Elevated LFTs: unclear etiology. No jaundice. No medication other than what is listed. No rash, easy bruising, fever, pruritus or skin discoloration.   Reviewed history: tubal ligation for contraception.   Review of Systems As per HPI    Objective:   Physical Exam BP 131/85  Pulse 98  Temp(Src) 98.4 F (36.9 C) (Oral)  Ht 5\' 6"  (1.676 m)  Wt 202 lb (91.627 kg)  BMI 32.60 kg/m2  General Appearance:    Alert, cooperative, no distress, appears stated age  Head:    Normocephalic, without obvious abnormality, atraumatic  Eyes:    PERRL, conjunctiva/corneas clear, EOM's intact, fundi    benign, both eyes  Ears:    Normal TM's and external ear canals, both ears  Nose:   Nares normal, septum midline, mucosa normal, no drainage    or sinus tenderness  Throat:   Lips, mucosa, and tongue normal; teeth and gums normal  Neck:   Supple, symmetrical, trachea midline, no adenopathy;    thyroid:  no enlargement/tenderness/nodules; no carotid   bruit or JVD  Back:     Symmetric, no curvature, ROM normal, no CVA tenderness  Lungs:     Clear to auscultation bilaterally, respirations unlabored  Chest Wall:    No tenderness or deformity   Heart:    Regular rate and rhythm, S1 and S2  normal, no murmur, rub   or gallop  Abdomen:     Soft, non-tender, bowel sounds active all four quadrants,    no masses, no organomegaly  Genitalia:    Normal female without lesion, discharge or tenderness  Extremities:   Extremities normal, atraumatic, no cyanosis or edema  Pulses:   2+ and symmetric all extremities  Skin:   Skin color, texture, turgor normal, no rashes or lesions  Lymph nodes:   Cervical, supraclavicular, and axillary nodes normal  Neurologic:   Grossly normal       Assessment and Plan:    See problem list

## 2011-12-04 NOTE — Assessment & Plan Note (Signed)
A: improving. Fasting blood sugars improved.  Medications: patient compliant with medications and tolerating them P: -continue current management -continue lifestyle modifications -referral to opthalmology made at last visit, patient awaiting referral.

## 2011-12-04 NOTE — Assessment & Plan Note (Signed)
Pelvic done. Pap sent.

## 2011-12-04 NOTE — Patient Instructions (Addendum)
Prachi,  Thank you for coming in to see me today.  For your diabetes: please continue to take your medications as prescribed and work on eating a low fat diet.  My nurses will call you once we can schedule your ophthalmology exam.  I will call you with the results of you pap smear next week and with the blood work results.   Plan to f/u in 1 month for diabetes follow up.  Dr. Armen Pickup

## 2011-12-14 ENCOUNTER — Telehealth: Payer: Self-pay | Admitting: Family Medicine

## 2011-12-14 DIAGNOSIS — K7581 Nonalcoholic steatohepatitis (NASH): Secondary | ICD-10-CM

## 2011-12-14 NOTE — Telephone Encounter (Signed)
Called patient at home. LFTs still elevated unclear etiology.  Patient with epigastric pain mild and persistent nausea.  Also informed her that Pap is negative.  Plan to evaluated liver and gallbladder: abdominal ultrasound.

## 2011-12-22 ENCOUNTER — Ambulatory Visit (HOSPITAL_COMMUNITY)
Admission: RE | Admit: 2011-12-22 | Discharge: 2011-12-22 | Disposition: A | Discharge status: Home / Self Care | Payer: Self-pay | Source: Ambulatory Visit | Attending: Family Medicine | Admitting: Family Medicine

## 2011-12-22 ENCOUNTER — Other Ambulatory Visit: Payer: Self-pay | Admitting: Family Medicine

## 2011-12-22 DIAGNOSIS — R945 Abnormal results of liver function studies: Secondary | ICD-10-CM | POA: Insufficient documentation

## 2011-12-22 DIAGNOSIS — R1013 Epigastric pain: Secondary | ICD-10-CM | POA: Insufficient documentation

## 2011-12-22 DIAGNOSIS — K7581 Nonalcoholic steatohepatitis (NASH): Secondary | ICD-10-CM

## 2011-12-22 DIAGNOSIS — R110 Nausea: Secondary | ICD-10-CM | POA: Insufficient documentation

## 2011-12-24 ENCOUNTER — Telehealth: Payer: Self-pay | Admitting: Family Medicine

## 2011-12-24 ENCOUNTER — Other Ambulatory Visit: Payer: Self-pay | Admitting: Family Medicine

## 2011-12-24 DIAGNOSIS — R109 Unspecified abdominal pain: Secondary | ICD-10-CM

## 2011-12-24 MED ORDER — RANITIDINE HCL 150 MG PO TABS: 150.0000 mg | ORAL_TABLET | Freq: Two times a day (BID) | ORAL | Status: DC

## 2011-12-24 MED ORDER — METFORMIN HCL 1000 MG PO TABS: 500.0000 mg | ORAL_TABLET | Freq: Two times a day (BID) | ORAL | Status: DC

## 2011-12-24 MED ORDER — FAMOTIDINE 20 MG PO TABS: 20.0000 mg | ORAL_TABLET | Freq: Every day | ORAL | Status: DC

## 2011-12-24 NOTE — Telephone Encounter (Signed)
Patient call back.  We discussed her abdominal ultrasound. She reports persistent epigastric pain and nausea. She denies vomiting. Pain is not worsened or relieved by eating.  She states that her symptoms interfere with sleep and appetite.  A: suspect diabetic gastroparesis but the pain is usual. U preg negative last month.  Plan:  -decrease metformin to 500 BID -start zantac -obtain abdominal CT scan with contrast

## 2011-12-24 NOTE — Telephone Encounter (Signed)
Called patient spoke to husband and informed them that her abdominal ultrasound was negative except for fatty liver. Her husband informed me that the patient is still having some pain.   He asked me to call the patient on her cell [phone]. I called this number and a child answered. I was unable to speak with the patient.

## 2011-12-28 ENCOUNTER — Ambulatory Visit (HOSPITAL_COMMUNITY)
Admission: RE | Admit: 2011-12-28 | Discharge: 2011-12-28 | Disposition: A | Discharge status: Home / Self Care | Payer: Self-pay | Source: Ambulatory Visit | Attending: Family Medicine | Admitting: Family Medicine

## 2011-12-28 DIAGNOSIS — R110 Nausea: Secondary | ICD-10-CM | POA: Insufficient documentation

## 2011-12-28 DIAGNOSIS — R109 Unspecified abdominal pain: Secondary | ICD-10-CM | POA: Insufficient documentation

## 2011-12-28 MED ORDER — IOHEXOL 300 MG/ML  SOLN
100.0000 mL | Freq: Once | INTRAMUSCULAR | Status: AC | PRN
  Administered 2011-12-28: 100 mL via INTRAVENOUS

## 2012-01-01 ENCOUNTER — Telehealth: Payer: Self-pay | Admitting: Family Medicine

## 2012-01-01 NOTE — Telephone Encounter (Signed)
Attempted to call patient about negative abdominal CT scan. No explanation from stomach pain on CT. Suspect gastritis vs. GI upset from metformin. Continue with H2 blocker and lower metformin dose.  Will send a letter to the patient.

## 2012-01-05 ENCOUNTER — Ambulatory Visit (INDEPENDENT_AMBULATORY_CARE_PROVIDER_SITE_OTHER): Payer: Self-pay | Admitting: Family Medicine

## 2012-01-05 ENCOUNTER — Encounter: Payer: Self-pay | Admitting: Family Medicine

## 2012-01-05 VITALS — BP 145/90 | HR 94 | Temp 98.2°F | Ht 66.0 in | Wt 199.0 lb

## 2012-01-05 DIAGNOSIS — E1165 Type 2 diabetes mellitus with hyperglycemia: Secondary | ICD-10-CM

## 2012-01-05 LAB — GLUCOSE, CAPILLARY: Glucose-Capillary: 149 mg/dL — ABNORMAL HIGH (ref 70–99)

## 2012-01-05 MED ORDER — GLIPIZIDE 5 MG PO TABS: 5.0000 mg | ORAL_TABLET | Freq: Two times a day (BID) | ORAL | Status: DC

## 2012-01-05 NOTE — Patient Instructions (Addendum)
Krystal Duran,   -Do not take metformin -continue [place] -start glipizide 5 mg in the evening x 2 week then add 5 mg in the morning if your fasting blood sugar remains elevated.  -Goal blood sugar fasting < 120, non-fasting < 200. If sugar is < 60 please do not take your medications and call.   Dr. Armen Pickup

## 2012-01-07 NOTE — Assessment & Plan Note (Signed)
A: improving. GI upset resolved since patient d/c metformin.  CBG (last 3) Non-fasting.  Basename 01/05/12 1417  GLUCAP 149*  P:  -d/c metformin due ti intolerance  -start glipizide.

## 2012-01-07 NOTE — Progress Notes (Signed)
Subjective:     Patient ID: Krystal Duran, female   DOB: 12/16/1971, 41 y.o.   MRN: [id number]  HPI 41 yo F presents for DM f/u:  1. DM: doing well. Taking [place]. Not taking metformin since CT scan of abdomen. GI pain and nausea has resolved since she stopped metformin. Denies low blood sugar.   Review of Systems As per HPI    Objective:   Physical Exam BP 145/90  Pulse 94  Temp 98.2 F (36.8 C) (Oral)  Ht 5\' 6"  (1.676 m)  Wt 199 lb (90.266 kg)  BMI 32.12 kg/m2  LMP 12/09/2011 General appearance: alert, cooperative and no distress Abdomen: soft, non-tender; bowel sounds normal; no masses,  no organomegaly  Assessment and Plan:

## 2012-01-14 ENCOUNTER — Telehealth: Payer: Self-pay | Admitting: Family Medicine

## 2012-01-14 ENCOUNTER — Other Ambulatory Visit: Payer: Self-pay | Admitting: Family Medicine

## 2012-01-14 MED ORDER — SITAGLIPTIN PHOSPHATE 100 MG PO TABS: 100.0000 mg | ORAL_TABLET | Freq: Every day | ORAL | Status: DC

## 2012-01-14 NOTE — Telephone Encounter (Signed)
Went to pick up her medicine Januvia, [facility]  Have been waiting 2 wks for it.  Given a coupon from them to get from [facility] but need a rx faxed to them in order to receive.

## 2012-01-14 NOTE — Telephone Encounter (Signed)
Rx sent; please notify patient. 

## 2012-03-01 ENCOUNTER — Other Ambulatory Visit: Payer: Self-pay | Admitting: Family Medicine

## 2012-05-09 ENCOUNTER — Encounter: Payer: Self-pay | Admitting: Home Health Services

## 2012-05-11 ENCOUNTER — Encounter: Payer: Self-pay | Admitting: Home Health Services

## 2012-08-31 ENCOUNTER — Telehealth: Payer: Self-pay | Admitting: *Deleted

## 2012-08-31 NOTE — Telephone Encounter (Signed)
Call made to schedule appt for A1C check.

## 2012-09-14 ENCOUNTER — Ambulatory Visit: Payer: Self-pay | Admitting: Family Medicine

## 2012-09-14 ENCOUNTER — Encounter: Payer: Self-pay | Admitting: *Deleted

## 2013-01-03 ENCOUNTER — Telehealth: Payer: Self-pay | Admitting: Family Medicine

## 2013-01-03 NOTE — Telephone Encounter (Signed)
Pt is requesting a 30 day supply of Januvia until she gets her orange card renewed. JW

## 2013-01-05 ENCOUNTER — Other Ambulatory Visit: Payer: Self-pay

## 2013-01-25 ENCOUNTER — Ambulatory Visit: Payer: Self-pay | Admitting: *Deleted

## 2013-01-27 ENCOUNTER — Ambulatory Visit: Payer: Self-pay | Admitting: Family Medicine

## 2013-02-06 ENCOUNTER — Encounter: Payer: Self-pay | Admitting: Family Medicine

## 2013-02-06 ENCOUNTER — Ambulatory Visit (INDEPENDENT_AMBULATORY_CARE_PROVIDER_SITE_OTHER): Payer: No Typology Code available for payment source | Admitting: Family Medicine

## 2013-02-06 VITALS — BP 147/80 | HR 85 | Temp 99.8°F | Ht 66.0 in | Wt 196.0 lb

## 2013-02-06 DIAGNOSIS — E1165 Type 2 diabetes mellitus with hyperglycemia: Secondary | ICD-10-CM

## 2013-02-06 DIAGNOSIS — Z23 Encounter for immunization: Secondary | ICD-10-CM

## 2013-02-06 DIAGNOSIS — Z8679 Personal history of other diseases of the circulatory system: Secondary | ICD-10-CM

## 2013-02-06 LAB — COMPLETE METABOLIC PANEL WITH GFR
ALT: 60 U/L — ABNORMAL HIGH (ref 0–35)
AST: 72 U/L — ABNORMAL HIGH (ref 0–37)
Alkaline Phosphatase: 87 U/L (ref 39–117)
Creat: 0.57 mg/dL (ref 0.50–1.10)
GFR, Est African American: >89 mL/min
Sodium: 135 mEq/L (ref 135–145)
Total Bilirubin: 0.4 mg/dL (ref 0.3–1.2)

## 2013-02-06 LAB — CBC
MCH: 26.1 pg (ref 26.0–34.0)
MCHC: 33.6 g/dL (ref 30.0–36.0)
MCV: 77.6 fL — ABNORMAL LOW (ref 78.0–100.0)
Platelets: 385 10*3/uL (ref 150–400)
RDW: 14.6 % (ref 11.5–15.5)

## 2013-02-06 LAB — POCT UA - GLUCOSE/PROTEIN: Protein, UA: NEGATIVE

## 2013-02-06 NOTE — Patient Instructions (Addendum)
Mrs. Hefel,  Thank you for coming in today. Your A1c is normal.  Ok to not take [place] and glucotrol as long as your blood sugars are well controlled. Fasting goal <127 After meal goal <200  Call if you are getting numbers higher than goal and we will restart [place]. I will be in touch with lab results.   I recommend a daily aspirin 81 mg if you can tolerate this.  I recommend stopping cigars to keep your blood pressure at goal of <140/90 to prevent stroke, heart disease, vision loss and kidney disease.   F/u in 3 months.    Dr. Armen Pickup

## 2013-02-06 NOTE — Assessment & Plan Note (Signed)
A: well controlled off meds x two months. Due for f/u opthalmology visit in 2015 (seen q 2 years).  P: Check urine microalbumin, lipids, cbc, cmp  F/u in 3 months for A1c Patient will call me for persistent fasting CBGs >127 or post prandial > 200.

## 2013-02-06 NOTE — Progress Notes (Signed)
Subjective:     Patient ID: Krystal Duran, female   DOB: 12/16/1971, 42 y.o.   MRN: [id number]  HPI 42 yo F presents for f/u to discuss the following:  1. DM2: diagnosed  in  2013. Never required insulin. Has not taken meds in 2 months (metformin or glipizide). Admits to chronic nausea. Also to mild SOB, recently started smoking cigars. No vomiting. No vision changes, CP. No tingling or numbness in extremities.   Due to f/u with opthalmology in 2015 (every 2 years).  2. HTN: elevated systolic BP today and at last visit. ROS as per above. No medications. Recently started smoking cigars.   Review of Systems As per HPI     Objective:   Physical Exam BP 147/80  Pulse 85  Temp(Src) 99.8 F (37.7 C) (Oral)  Ht 5\' 6"  (1.676 m)  Wt 196 lb (88.905 kg)  BMI 31.65 kg/m2 General appearance: alert, cooperative and no distress Neck: no adenopathy, no carotid bruit, no JVD, supple, symmetrical, trachea midline and thyroid not enlarged, symmetric, no tenderness/mass/nodules Lungs: clear to auscultation bilaterally Heart: regular rate and rhythm, S1, S2 normal, no murmur, click, rub or gallop Extremities: extremities normal, atraumatic, no cyanosis or edema Pulses: 2+ and symmetric Skin: Skin color, texture, turgor normal. No rashes or lesions  Lab Results  Component Value Date   HGBA1C 7.1 02/06/2013   Assessment and Plan:

## 2013-02-09 ENCOUNTER — Encounter: Payer: Self-pay | Admitting: Family Medicine

## 2013-02-09 ENCOUNTER — Other Ambulatory Visit: Payer: Self-pay | Admitting: Family Medicine

## 2013-02-09 DIAGNOSIS — K7581 Nonalcoholic steatohepatitis (NASH): Secondary | ICD-10-CM

## 2013-02-09 MED ORDER — ASPIRIN EC 81 MG PO TBEC: 81.0000 mg | DELAYED_RELEASE_TABLET | Freq: Every day | ORAL | Status: AC

## 2013-02-09 NOTE — Assessment & Plan Note (Addendum)
A: BP elevated today. Discussed BP goal.  P: Stop cigars. F/u for BP check in 3 months.  Call and f/u sooner if BP elevated at home or in community.

## 2013-02-17 ENCOUNTER — Other Ambulatory Visit: Payer: No Typology Code available for payment source

## 2013-02-17 DIAGNOSIS — K7581 Nonalcoholic steatohepatitis (NASH): Secondary | ICD-10-CM

## 2013-02-17 NOTE — Progress Notes (Signed)
FLP DONE TODAY Krystal Duran 

## 2013-02-18 LAB — LIPID PANEL
Cholesterol: 197 mg/dL (ref 0–200)
HDL: 46 mg/dL (ref >39)
Total CHOL/HDL Ratio: 4.3 Ratio
Triglycerides: 159 mg/dL — ABNORMAL HIGH (ref <150)

## 2013-02-20 ENCOUNTER — Encounter: Payer: Self-pay | Admitting: Family Medicine

## 2013-02-21 ENCOUNTER — Encounter: Payer: Self-pay | Admitting: Family Medicine

## 2013-02-21 ENCOUNTER — Other Ambulatory Visit: Payer: Self-pay | Admitting: Family Medicine

## 2013-02-21 MED ORDER — PRAVASTATIN SODIUM 40 MG PO TABS: 40.0000 mg | ORAL_TABLET | Freq: Every day | ORAL | Status: DC

## 2013-02-22 ENCOUNTER — Telehealth: Payer: Self-pay | Admitting: Family Medicine

## 2013-02-22 MED ORDER — PRAVASTATIN SODIUM 40 MG PO TABS: 40.0000 mg | ORAL_TABLET | Freq: Every day | ORAL | Status: DC

## 2013-02-22 NOTE — Telephone Encounter (Signed)
Pt called because the medicine that she was prescribe yesterday 8/26 was sent to [facility] and they only have the brand name no generic's. She would like this now sent to [facility] on [address] since they have generic's and it would be cheaper. JW

## 2013-02-22 NOTE — Telephone Encounter (Signed)
Pt called and informed that she needs to request [facility] transfer script to them from [facility] - verbalized understanding Wyatt Haste, RN-BSN

## 2013-02-22 NOTE — Addendum Note (Signed)
Addended by: Dessa Phi on: 02/22/2013 03:38 PM   Modules accepted: Orders

## 2013-03-14 ENCOUNTER — Ambulatory Visit (INDEPENDENT_AMBULATORY_CARE_PROVIDER_SITE_OTHER): Payer: No Typology Code available for payment source | Admitting: Family Medicine

## 2013-03-14 ENCOUNTER — Encounter: Payer: Self-pay | Admitting: Family Medicine

## 2013-03-14 VITALS — BP 145/82 | HR 103 | Temp 99.5°F | Ht 66.0 in | Wt 196.9 lb

## 2013-03-14 DIAGNOSIS — Z8679 Personal history of other diseases of the circulatory system: Secondary | ICD-10-CM

## 2013-03-14 DIAGNOSIS — Z23 Encounter for immunization: Secondary | ICD-10-CM

## 2013-03-14 DIAGNOSIS — E1165 Type 2 diabetes mellitus with hyperglycemia: Secondary | ICD-10-CM

## 2013-03-14 MED ORDER — GLIPIZIDE 5 MG PO TABS: 5.0000 mg | ORAL_TABLET | Freq: Two times a day (BID) | ORAL | Status: DC

## 2013-03-14 NOTE — Assessment & Plan Note (Signed)
A: elevated BP x two. Patient resistant to starting medication. P: Decrease cigar to once weekly Increase exercise  Recheck at f/u.

## 2013-03-14 NOTE — Patient Instructions (Addendum)
Krystal Duran,  Thank you for coming in today. For your blood sugar please restart glipizide 5 mg twice daily. For your elevated blood pressure: please decrease cigars to once weekly. Work on regular exercise with goal of working out 4 times per week. Avoid extra salt in your diet.  F/u with me in 1 month to review blood sugars.  Dr. Armen Pickup

## 2013-03-14 NOTE — Progress Notes (Signed)
Subjective:     Patient ID: Krystal Duran, female   DOB: 12/16/1971, 42 y.o.   MRN: [id number]  HPI 42 yo F presents to f/u:  1. DM2: diet controlled. Elevating fasting CBGs 199-270. Admits to eating well. Low card diet. 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 Interested in restarting oral medication.   2. HTN: BP elevated. Denies CP, SOB. Admits to smoking cigar twice weekly. Weight unchanged.   Review of Systems As per HPI     Objective:   Physical Exam BP 145/82  Pulse 103  Temp(Src) 99.5 F (37.5 C) (Oral)  Ht 5\' 6"  (1.676 m)  Wt 196 lb 14.4 oz (89.313 kg)  BMI 31.8 kg/m2 General appearance: alert, cooperative and no distress, sweaty  Lungs: clear to auscultation bilaterally Heart: regular rate and rhythm, S1, S2 normal, no murmur, click, rub or gallop    Assessment and Plan:

## 2013-03-14 NOTE — Assessment & Plan Note (Signed)
A: declined with elevated fasting CBGs. P: Restart glipizide.

## 2013-03-20 ENCOUNTER — Ambulatory Visit (INDEPENDENT_AMBULATORY_CARE_PROVIDER_SITE_OTHER): Payer: No Typology Code available for payment source | Admitting: Family Medicine

## 2013-03-20 ENCOUNTER — Encounter: Payer: Self-pay | Admitting: Family Medicine

## 2013-03-20 VITALS — BP 138/88 | HR 80 | Temp 98.6°F | Ht 66.0 in | Wt 195.7 lb

## 2013-03-20 DIAGNOSIS — Z720 Tobacco use: Secondary | ICD-10-CM

## 2013-03-20 DIAGNOSIS — Z8679 Personal history of other diseases of the circulatory system: Secondary | ICD-10-CM

## 2013-03-20 DIAGNOSIS — R05 Cough: Secondary | ICD-10-CM | POA: Insufficient documentation

## 2013-03-20 DIAGNOSIS — F17200 Nicotine dependence, unspecified, uncomplicated: Secondary | ICD-10-CM

## 2013-03-20 DIAGNOSIS — J069 Acute upper respiratory infection, unspecified: Secondary | ICD-10-CM

## 2013-03-20 NOTE — Assessment & Plan Note (Signed)
Likely viral URI combined w/ hyper-immune reaction to flu shot - Mucinex D, Motrin 600 Q6, saline nasal spray - Precautions given and all questions answered - Pt may need ABX if does not clear in 3-5 days or develops fevers or worsening symptoms may develop bacterial superinfection

## 2013-03-20 NOTE — Patient Instructions (Addendum)
Thank you for coming in today You are doing well overall. You are likely suffering from a virla cold and an increased immune respons to the flu shot.  Please start taking motrin/Ibuprofen 600mg  every 6 hours. This will help with the pain and make you feel better in general Please start taking Mucinex D for the cough and runny nose Consider using saline nasal spray. If you symptoms get worse, develop fever, or do not resolve by Friday you may need antibiotics to clear up this infection  Have a great day

## 2013-03-20 NOTE — Progress Notes (Signed)
Krystal Duran is a 42 y.o. female who presents to [facility] today for cough.  Cough: started on Friday. Shortly after developed sore throat, shoulder aches, chest hurting w/ cough, runny nose (today), fatigue. Denies fevers, v/d/ rash. Nyquil and dayquil w/ some benefit. Cough wakes up at night. Denies any exacerbating factors. Constant. Flu shot on Tue/Wed of last week. H/o previous malaise to last flu shot 5 years ago.   Tobacco: 4 cigars wkly. Just started 2 years ago and not interested in quitting.   The following portions of the patient's history were reviewed and updated as appropriate: allergies, current medications, past medical history, family and social history, and problem list.  Patient is a smoker  Past Medical History  Diagnosis Date  . Arthritis 2005 and 2009    Pregnancy induced   . Gestational diabetes 2009    Had to take oral hypoglycemic   . Hypertension 2009     Gestational HTN, had to take medication in 2009  . Costochondritis 09/11/2011  . Fatigue 12/19/2010  . Breast pain in female 12/19/2010    ROS as above otherwise neg.    Medications reviewed. Current Outpatient Prescriptions  Medication Sig Dispense Refill  . aspirin EC 81 MG tablet Take 1 tablet (81 mg total) by mouth daily.      Marland Kitchen glipiZIDE (GLUCOTROL) 5 MG tablet Take 1 tablet (5 mg total) by mouth 2 (two) times daily before a meal.  60 tablet  3  . pravastatin (PRAVACHOL) 40 MG tablet Take 1 tablet (40 mg total) by mouth daily.  90 tablet  3   No current facility-administered medications for this visit.    Exam:  BP 138/88  Pulse 80  Temp(Src) 98.6 F (37 C) (Oral)  Ht 5\' 6"  (1.676 m)  Wt 195 lb 11.2 oz (88.769 kg)  BMI 31.6 kg/m2 Gen: Well NAD HEENT: EOMI,  MMM, frontal sinus ttp, Pharyngeal injection w/o exudate/plaques. Tonsils 1+ bilat. R anterior cervbical lymphadenopathy Lungs: CTABL Nl WOB Heart: RRR no MRG Abd: NABS, NT, ND Exts: Non edematous BL  LE, warm and well perfused.  Skin:  No  results found for this or any previous visit (from the past 72 hour(s)).

## 2013-03-20 NOTE — Assessment & Plan Note (Signed)
Improved today over past several readings No intervention

## 2013-03-20 NOTE — Assessment & Plan Note (Signed)
Just started smokign 2 years ago. 4 cigars wkly Not interested in quitting Will defer further discussion to PCP who has relationship w/ pt

## 2013-04-07 ENCOUNTER — Ambulatory Visit (INDEPENDENT_AMBULATORY_CARE_PROVIDER_SITE_OTHER): Payer: No Typology Code available for payment source | Admitting: *Deleted

## 2013-04-07 DIAGNOSIS — Z111 Encounter for screening for respiratory tuberculosis: Secondary | ICD-10-CM

## 2013-04-07 NOTE — Progress Notes (Signed)
Tuberculin skin test applied to left ventral forearm. Explained how to read the test, measuring induration not just erythema; she will come into office in 48-72 hours to have test read. 

## 2013-04-10 ENCOUNTER — Ambulatory Visit (INDEPENDENT_AMBULATORY_CARE_PROVIDER_SITE_OTHER): Payer: No Typology Code available for payment source | Admitting: *Deleted

## 2013-04-10 ENCOUNTER — Encounter: Payer: Self-pay | Admitting: *Deleted

## 2013-04-10 DIAGNOSIS — Z111 Encounter for screening for respiratory tuberculosis: Secondary | ICD-10-CM

## 2013-04-10 LAB — TB SKIN TEST: Induration: 0 mm

## 2013-04-18 ENCOUNTER — Encounter: Payer: Self-pay | Admitting: Family Medicine

## 2013-04-18 ENCOUNTER — Ambulatory Visit (INDEPENDENT_AMBULATORY_CARE_PROVIDER_SITE_OTHER): Payer: No Typology Code available for payment source | Admitting: Family Medicine

## 2013-04-18 VITALS — BP 110/78 | HR 84 | Temp 98.6°F | Ht 66.0 in | Wt 196.0 lb

## 2013-04-18 DIAGNOSIS — J069 Acute upper respiratory infection, unspecified: Secondary | ICD-10-CM

## 2013-04-18 MED ORDER — BENZONATATE 100 MG PO CAPS: 200.0000 mg | ORAL_CAPSULE | Freq: Three times a day (TID) | ORAL | Status: DC | PRN

## 2013-04-18 MED ORDER — AZITHROMYCIN 250 MG PO TABS: ORAL_TABLET | ORAL | Status: DC

## 2013-04-18 NOTE — Patient Instructions (Addendum)
It was great seeing you today. Below is a list of the things we talked about:   1. Take Azithromycin for 5 days 2. Use Tessolon pearls as needed for cough  Please check-out at the front desk before leaving the clinic, and make an appointment in 10 days with Dr Armen Pickup, or sooner if developing chest pain shortness of breath, or new/worsening symptoms.   I look forward to talking with you again at our next visit. If you have any questions or concerns before then, please call the clinic at [phone].  Take Care,   Dr Wenda Low

## 2013-04-18 NOTE — Progress Notes (Signed)
Subjective:     Patient ID: Krystal Duran, female   DOB: 12/16/1971, 42 y.o.   MRN: [id number]  HPI Comments: Cough was become productive since seen in clinic ~ 1 mo ago. Has received flu shot this year, no recent abx use, but son treated with Zpack ~ 1 wk ago for PNA. Hx of cigar use, but not smoking since coughing began. No symptoms of HF, GERD, or post nasal drip. No hx of asthma/COPD.   Cough This is a new problem. The current episode started more than 1 month ago. The problem has been gradually worsening. The problem occurs every few minutes. The cough is productive of purulent sputum. Associated symptoms include chills, a fever, headaches, heartburn, myalgias and a sore throat. Pertinent negatives include no chest pain, eye redness, hemoptysis, nasal congestion, postnasal drip, rash, rhinorrhea, shortness of breath, sweats, weight loss or wheezing. Nothing aggravates the symptoms. Risk factors for lung disease include smoking/tobacco exposure. She has tried OTC cough suppressant for the symptoms. The treatment provided no relief. There is no history of asthma, bronchitis or COPD.     Review of Systems  Constitutional: Positive for fever and chills. Negative for weight loss and unexpected weight change.  HENT: Positive for sore throat. Negative for congestion, postnasal drip, rhinorrhea and sinus pressure.   Eyes: Negative for redness and itching.  Respiratory: Positive for cough. Negative for hemoptysis, chest tightness, shortness of breath and wheezing.   Cardiovascular: Negative for chest pain.  Gastrointestinal: Positive for heartburn. Negative for nausea, vomiting, abdominal pain, diarrhea and constipation.  Musculoskeletal: Positive for myalgias.  Skin: Negative for rash.  Neurological: Positive for headaches.  Hematological: Negative for adenopathy.       Objective:   Physical Exam  Constitutional: She is oriented to person, place, and time. She appears well-developed and  well-nourished. No distress.  HENT:  Mouth/Throat: No oropharyngeal exudate.  Oropharynx erythematous  Eyes: Conjunctivae and EOM are normal. Pupils are equal, round, and reactive to light. Right eye exhibits no discharge. Left eye exhibits no discharge. No scleral icterus.  Neck: Normal range of motion. No thyromegaly present.  Cardiovascular: Normal rate, regular rhythm, normal heart sounds and intact distal pulses.   Pulmonary/Chest: Effort normal and breath sounds normal. No respiratory distress. She has no wheezes. She has no rales. She exhibits no tenderness.  Lymphadenopathy:    She has no cervical adenopathy.  Neurological: She is alert and oriented to person, place, and time.  Skin: Skin is warm. No rash noted. She is not diaphoretic.       Assessment/Plan:     See Problem focused A&P

## 2013-04-18 NOTE — Assessment & Plan Note (Addendum)
Persistent cough ~ 1 month; with worsening symptoms: Productive cough, subjective fevers - Son treated w/ Z pack for PNA ~ 1 week ago - Her lungs sound clear, no tachypnea DDx: most likely bronchitis, but possible PNA, Pertussis  - Z pack & Tessalon pearls - f/u w/ PCP in 10 days or sooner if new/worsening symptoms

## 2013-04-28 ENCOUNTER — Encounter: Payer: Self-pay | Admitting: Family Medicine

## 2013-04-28 ENCOUNTER — Ambulatory Visit (INDEPENDENT_AMBULATORY_CARE_PROVIDER_SITE_OTHER): Payer: No Typology Code available for payment source | Admitting: Family Medicine

## 2013-04-28 VITALS — BP 124/82 | HR 92 | Temp 98.2°F | Wt 195.0 lb

## 2013-04-28 DIAGNOSIS — E1165 Type 2 diabetes mellitus with hyperglycemia: Secondary | ICD-10-CM

## 2013-04-28 DIAGNOSIS — J069 Acute upper respiratory infection, unspecified: Secondary | ICD-10-CM

## 2013-04-28 MED ORDER — BENZONATATE 100 MG PO CAPS: 200.0000 mg | ORAL_CAPSULE | Freq: Three times a day (TID) | ORAL | Status: DC | PRN

## 2013-04-28 NOTE — Progress Notes (Signed)
  Subjective:    Patient ID: Krystal Duran, female    DOB: 12/16/1971, 42 y.o.   MRN: [id number]  HPI 42 year female presents for follow visit to discuss the following:  #1 cough: The patient treated with azithromycin for one-month cough was initially dry then became productive. She reports she is feeling much better today. She still has a cough but is nonproductive. There is no fever. She does have burning sensation in her throat. She denies reflux. She denies abdominal chest pain.  #2 diabetes: Patient compliant with her glipizide. Denies hyperglycemia and hypoglycemia. She is due for diabetic eye exam.  Review of Systems As per history of present illness    Objective:   Physical Exam BP 124/82  Pulse 92  Temp(Src) 98.2 F (36.8 C) (Oral)  Wt 195 lb (88.451 kg)  BMI 31.49 kg/m2  LMP 04/14/2013 General appearance: alert, cooperative and no distress Throat: lips, mucosa, and tongue normal; teeth and gums normal, tonsils are enlarged w/o erythema or exudate Neck: no adenopathy, no carotid bruit, no JVD, supple, symmetrical, trachea midline and thyroid not enlarged, symmetric, no tenderness/mass/nodules Lungs: clear to auscultation bilaterally Heart: regular rate and rhythm, S1, S2 normal, no murmur, click, rub or gallop     Assessment & Plan:

## 2013-04-28 NOTE — Assessment & Plan Note (Signed)
A: well controlled when last checked Meds: compliant P: Retinal scan today

## 2013-04-28 NOTE — Patient Instructions (Signed)
Krystal Duran,  Thank you for coming in today. I am happy to hear that you are feeling better.  I have refilled tessalon for your cough. Please gargle with warm salt water and drink warm tea to soothe your throat.  Dr. Armen Pickup

## 2013-04-28 NOTE — Addendum Note (Signed)
Addended by: Jennette Bill on: 04/28/2013 09:05 AM   Modules accepted: Orders

## 2013-04-28 NOTE — Assessment & Plan Note (Signed)
A: improved with azithromycin. Persistent cough. Re andcent negative PPD.  P: Refilled tessalon perles couunselled patient that post infectious cough can linger for 8 weeks.

## 2013-05-04 ENCOUNTER — Telehealth: Payer: Self-pay | Admitting: Family Medicine

## 2013-05-04 NOTE — Telephone Encounter (Signed)
Please call patient. Retinal scan from 04/28/2013 obtained to evaluate eye changes related to diabetes (diabetic retinopathy) was negative. Plan to repeat in one year.

## 2013-05-04 NOTE — Telephone Encounter (Signed)
Pt husbands informed. Sarie Stall, Maryjo Rochester

## 2013-07-10 ENCOUNTER — Ambulatory Visit (INDEPENDENT_AMBULATORY_CARE_PROVIDER_SITE_OTHER): Payer: No Typology Code available for payment source | Admitting: Family Medicine

## 2013-07-10 ENCOUNTER — Encounter: Payer: Self-pay | Admitting: Family Medicine

## 2013-07-10 VITALS — BP 115/79 | HR 105 | Temp 98.4°F | Resp 19 | Ht 66.0 in | Wt 192.2 lb

## 2013-07-10 DIAGNOSIS — R42 Dizziness and giddiness: Secondary | ICD-10-CM

## 2013-07-10 DIAGNOSIS — G44209 Tension-type headache, unspecified, not intractable: Secondary | ICD-10-CM

## 2013-07-10 DIAGNOSIS — E1165 Type 2 diabetes mellitus with hyperglycemia: Secondary | ICD-10-CM

## 2013-07-10 DIAGNOSIS — D649 Anemia, unspecified: Secondary | ICD-10-CM

## 2013-07-10 DIAGNOSIS — IMO0002 Reserved for concepts with insufficient information to code with codable children: Secondary | ICD-10-CM

## 2013-07-10 DIAGNOSIS — IMO0001 Reserved for inherently not codable concepts without codable children: Secondary | ICD-10-CM

## 2013-07-10 HISTORY — DX: Anemia, unspecified: D64.9

## 2013-07-10 LAB — POCT HEMOGLOBIN: HEMOGLOBIN: 11.2 g/dL — AB (ref 12.2–16.2)

## 2013-07-10 LAB — POCT GLYCOSYLATED HEMOGLOBIN (HGB A1C): Hemoglobin A1C: 9.4

## 2013-07-10 LAB — GLUCOSE, CAPILLARY: Glucose-Capillary: 282 mg/dL — ABNORMAL HIGH (ref 70–99)

## 2013-07-10 MED ORDER — FERROUS SULFATE 325 (65 FE) MG PO TABS: 325.0000 mg | ORAL_TABLET | Freq: Two times a day (BID) | ORAL | Status: DC

## 2013-07-10 MED ORDER — GLIPIZIDE 10 MG PO TABS: 10.0000 mg | ORAL_TABLET | Freq: Two times a day (BID) | ORAL | Status: DC

## 2013-07-10 NOTE — Assessment & Plan Note (Signed)
A: slight decline in setting of menorrhagia. P: Oral iron therapy.

## 2013-07-10 NOTE — Assessment & Plan Note (Signed)
A: declined with elevated A1c suspect dizziness related to hyperglycemia and some dehydration. P: Increase intake of water Increase glipizide to 10 mg daily from 5 mg daily  Exercise. Low carb diet F/u in 3-4 weeks.

## 2013-07-10 NOTE — Assessment & Plan Note (Signed)
A: mild today. Suspect hypovolemia in the setting of hyperglycemia P: see DM A&P

## 2013-07-10 NOTE — Patient Instructions (Signed)
Mrs. Krystal Duran,  Your hemoglobin has dropped one points since it was last checked: Lab Results  Component Value Date   HGB 11.2* 07/10/2013  For this reason please start oral iron therapy. Take the tablet twice daily with vitamin C (orange juice to increase absorption).   Your A1c is up to just over 9 from 7. Your blood sugar is also elevated.  For this please increase glipizide to 10 mg twice daily.  Stress, too many carbs and too little exercise will raise A1c/blood sugars.    For your diet and exercise:  1. Make sure to eat breakfast, lunch and dinner (also may add one snack mid morning or mid afternoon).  2. Carbs: no more than 2 servings (30 gram/2oz) per meal and 1 serving per snack.  3. Exercise such that you sweat some and your heart rate goes up starting with twice a week (brisk walking is a good start or an aerobic video).  4. Water, water, water  5. Check blood sugar 2-3 x per week to get a feel for how different foods effect your blood sugar:  Goal fasting <100  Goal after eating < 200  Your current headache has been ongoing for too long. Please use heat and exercise to reduce it.   See me in f/u in 3-4 weeks sooner if you are still having symptoms  Dr. Armen PickupFunches

## 2013-07-10 NOTE — Assessment & Plan Note (Signed)
Patient presents with tension HA today. No s/s to suggest stroke and intracranial mass. Declines dose of narcotic pain medication as pain is mild.   Reassurance Heat to neck  Exercise for stress reduction.

## 2013-07-10 NOTE — Progress Notes (Signed)
   Subjective:    Patient ID: Krystal EdwardsSiham Lorah, female    DOB: 12/16/1971, 43 y.o.   MRN: [id number]  HPI 43 yo F presents for same day visit:  1. Dizziness: x 2 weeks. Off and on. Most recent episodes at home while standing in kitchen. Associated with nausea and feeling lightheaded. Relieved with sitting down. Also while walking through store felt dizzy while shopping with nausea and lightheadedness. Denies weakness, slurred speech, vision loss, blurred vision, chest pains. Admits to mild SOB with exertion. Admits to b/l hand numbness.   2. Headache: b/l occipital x 13 days. Mild today, tightness, intermittent burning sensation. Associated with nausea. No vomiting. No head trauma. Has hx of menstrual migraines, this HA is different in that it is lasting past menses. 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 Does not exercise. Denies alcohol and IDU.   LMP:  07/03/13 x 5 days, heavy.  Soc hx: smokes a cigars three times weekly  Review of Systems As per HPI     Objective:   Physical Exam BP 115/79  Pulse 105  Temp(Src) 98.4 F (36.9 C)  Resp 19  Ht 5\' 6"  (1.676 m)  Wt 192 lb 3.2 oz (87.181 kg)  BMI 31.04 kg/m2  LMP 07/03/2013 Orthostatic VS obtained and negative.  General appearance: alert, cooperative and no distress Head: Normocephalic, without obvious abnormality, atraumatic, Mild TTP b/l occipital.  Eyes: conjunctivae/corneas clear. PERRL, EOM's intact. . Neck: no adenopathy, no carotid bruit, no JVD, supple, symmetrical, trachea midline and thyroid not enlarged, symmetric, no tenderness/mass/nodules Lungs: clear to auscultation bilaterally Abdomen: soft, non-tender; bowel sounds normal; no masses,  no organomegaly Extremities: extremities normal, atraumatic, no cyanosis or edema Skin: Skin color, texture, turgor normal. No rashes or lesions Neurologic: Alert and oriented X 3, normal strength and tone. Normal symmetric reflexes. Normal  coordination and gait  Lab Results  Component Value Date   HGBA1C 9.4 07/10/2013   Lab Results  Component Value Date   HGB 11.2* 07/10/2013   CBG (last 3)   Recent Labs  07/10/13 1205  GLUCAP 282*         Assessment & Plan:

## 2013-10-13 ENCOUNTER — Ambulatory Visit: Payer: No Typology Code available for payment source

## 2013-10-31 ENCOUNTER — Ambulatory Visit (INDEPENDENT_AMBULATORY_CARE_PROVIDER_SITE_OTHER): Payer: No Typology Code available for payment source | Admitting: Family Medicine

## 2013-10-31 ENCOUNTER — Encounter: Payer: Self-pay | Admitting: Family Medicine

## 2013-10-31 VITALS — BP 146/84 | HR 83 | Temp 98.5°F | Ht 66.0 in | Wt 190.2 lb

## 2013-10-31 DIAGNOSIS — R071 Chest pain on breathing: Secondary | ICD-10-CM

## 2013-10-31 DIAGNOSIS — D649 Anemia, unspecified: Secondary | ICD-10-CM

## 2013-10-31 DIAGNOSIS — L719 Rosacea, unspecified: Secondary | ICD-10-CM | POA: Insufficient documentation

## 2013-10-31 DIAGNOSIS — IMO0002 Reserved for concepts with insufficient information to code with codable children: Secondary | ICD-10-CM

## 2013-10-31 DIAGNOSIS — E1165 Type 2 diabetes mellitus with hyperglycemia: Secondary | ICD-10-CM

## 2013-10-31 DIAGNOSIS — R0789 Other chest pain: Secondary | ICD-10-CM | POA: Insufficient documentation

## 2013-10-31 DIAGNOSIS — IMO0001 Reserved for inherently not codable concepts without codable children: Secondary | ICD-10-CM

## 2013-10-31 LAB — LIPID PANEL
CHOL/HDL RATIO: 4.7 ratio
CHOLESTEROL: 200 mg/dL (ref 0–200)
HDL: 43 mg/dL (ref >39)
LDL Cholesterol: 110 mg/dL — ABNORMAL HIGH (ref 0–99)
Triglycerides: 237 mg/dL — ABNORMAL HIGH (ref <150)
VLDL: 47 mg/dL — ABNORMAL HIGH (ref 0–40)

## 2013-10-31 LAB — POCT GLYCOSYLATED HEMOGLOBIN (HGB A1C): Hemoglobin A1C: 8.3

## 2013-10-31 LAB — CBC
HEMATOCRIT: 39.7 % (ref 36.0–46.0)
HEMOGLOBIN: 13.8 g/dL (ref 12.0–15.0)
MCH: 28.5 pg (ref 26.0–34.0)
MCHC: 34.8 g/dL (ref 30.0–36.0)
MCV: 82 fL (ref 78.0–100.0)
Platelets: 316 10*3/uL (ref 150–400)
RBC: 4.84 MIL/uL (ref 3.87–5.11)
RDW: 13.8 % (ref 11.5–15.5)
WBC: 10 10*3/uL (ref 4.0–10.5)

## 2013-10-31 LAB — IRON AND TIBC
%SAT: 8 % — AB (ref 20–55)
Iron: 32 ug/dL — ABNORMAL LOW (ref 42–145)
TIBC: 399 ug/dL (ref 250–470)
UIBC: 367 ug/dL (ref 125–400)

## 2013-10-31 MED ORDER — GLIPIZIDE 10 MG PO TABS: 10.0000 mg | ORAL_TABLET | Freq: Two times a day (BID) | ORAL | Status: DC

## 2013-10-31 MED ORDER — METRONIDAZOLE 1 % EX GEL: Freq: Every day | CUTANEOUS | Status: DC

## 2013-10-31 MED ORDER — FERROUS SULFATE 325 (65 FE) MG PO TABS: 325.0000 mg | ORAL_TABLET | Freq: Two times a day (BID) | ORAL | Status: DC

## 2013-10-31 NOTE — Assessment & Plan Note (Signed)
L rib pain: most likely muscle spasm. Unlikely to be fracture with lack of trauma. Plan for Rib x-ray. Ice and tylenol at home as needed.

## 2013-10-31 NOTE — Progress Notes (Signed)
   Subjective:    Patient ID: Krystal Duran, female    DOB: 12/16/1971, 43 y.o.   MRN: [id number] CC: DM2 f/u, acne, L rib pain  HPI 43 year old female presents for followup discuss the following:  #1 DM2: Patient compliant with oral therapy. Patient does not check CBGs. Patient has polyuria and polydipsia. Patient has intermittent headaches with her menses. Patient denies chest pain shortness of breath.  #2 Left rib discomfort: Patient with 2 weeks of noticeable left rib discomfort. Discomfort is exacerbated by deep breathing trunk rotation to the left and palpation. Patient denies trauma.  #3 anemia: Patient has history of anemia and is on iron therapy. She has and she should continue iron therapy. She admits to dark stools. She denies constipation. She admits to intermittently having heavy menses. Last 2 menstrual periods have been heavy.  Review of Systems As per HPI     Objective:   Physical Exam BP 146/84  Pulse 83  Temp(Src) 98.5 F (36.9 C) (Oral)  Ht 5\' 6"  (1.676 m)  Wt 190 lb 3.2 oz (86.274 kg)  BMI 30.71 kg/m2 Wt Readings from Last 3 Encounters:  10/31/13 190 lb 3.2 oz (86.274 kg)  07/10/13 192 lb 3.2 oz (87.181 kg)  04/28/13 195 lb (88.451 kg)  General appearance: alert, cooperative and no distress Skin: erythema in malar distribution with papules on forehead and cheeks  Chest wall: L lower rib tenderness. No skin changes. No induration.  Throat: lips, mucosa, and tongue normal; teeth and gums normal Lungs: clear to auscultation bilaterally Heart: regular rate and rhythm, S1, S2 normal, no murmur, click, rub or gallop Extremities: edema trace  Lab Results  Component Value Date   HGBA1C 8.3 10/31/2013   and       Assessment & Plan:

## 2013-10-31 NOTE — Assessment & Plan Note (Signed)
A: improving with down trending A1c.  P: 1. DM2: continue current medication. Increase walking to further improve blood sugar control.

## 2013-10-31 NOTE — Patient Instructions (Addendum)
Mrs. Fonda KinderMaanaki,  Thank you for coming in today. Your A1c is coming down! Great work.  1. DM2: continue current medication. Increase walking to further improve blood sugar control.  2. Acne on cheeks and forehead concerning for rosacea. Stop ROC for now Metrogel  Sunscreen moisturizer over Metrogel.   3. L rib pain: most likely muscle spasm. Unlikely to be fracture with lack of trauma. Plan for Rib x-ray. Ice and tylenol at home as needed.  Dr. Armen PickupFunches

## 2013-10-31 NOTE — Assessment & Plan Note (Signed)
Erythematous acneiform rash  on cheeks and forehead concerning for rosacea. Stop ROC for now Metrogel

## 2013-11-01 ENCOUNTER — Telehealth: Payer: Self-pay | Admitting: Family Medicine

## 2013-11-01 ENCOUNTER — Encounter: Payer: Self-pay | Admitting: Family Medicine

## 2013-11-01 DIAGNOSIS — D649 Anemia, unspecified: Secondary | ICD-10-CM

## 2013-11-01 DIAGNOSIS — IMO0002 Reserved for concepts with insufficient information to code with codable children: Secondary | ICD-10-CM

## 2013-11-01 DIAGNOSIS — E1165 Type 2 diabetes mellitus with hyperglycemia: Secondary | ICD-10-CM

## 2013-11-01 LAB — FERRITIN: FERRITIN: 57 ng/mL (ref 10–291)

## 2013-11-01 MED ORDER — ROSUVASTATIN CALCIUM 20 MG PO TABS: 20.0000 mg | ORAL_TABLET | Freq: Every day | ORAL | Status: DC

## 2013-11-01 NOTE — Telephone Encounter (Signed)
[facility] states they never received RX for Metrogel yesterday. Please call pharmacy to confirm this.

## 2013-11-01 NOTE — Assessment & Plan Note (Signed)
Repeat hgb and ferritin wnl. Stop ferritin.  Filed problem to history and resolved.

## 2013-11-01 NOTE — Telephone Encounter (Signed)
Called patient. Call completed.  Normal Hgb and ferritin, patient can stop oral iron therapy. Elevated TGs, patient to increase exercise. Would like to switch to high intensity statin, Lipitor or crestor   Switched to BJ'screstor. Patient agreeable to change.

## 2013-11-01 NOTE — Assessment & Plan Note (Signed)
Elevated TGs, patient to increase exercise. Would like to switch to high intensity statin, Lipitor or crestor   Switched to BJ'screstor. Patient agreeable to change.

## 2013-11-01 NOTE — Telephone Encounter (Signed)
Pharmacy tech states that Rx not there, but when I called into pharmacist she saw where the med was sent in. [name] (pharmamcist) advises that Rx will be ready ~2 hours.  Pt informed. Krystal PellegriniJessica D Azana Kiesler

## 2013-11-02 ENCOUNTER — Telehealth: Payer: Self-pay | Admitting: Family Medicine

## 2013-11-02 MED ORDER — ATORVASTATIN CALCIUM 40 MG PO TABS: 40.0000 mg | ORAL_TABLET | Freq: Every day | ORAL | Status: DC

## 2013-11-02 NOTE — Telephone Encounter (Signed)
Please inform patient. Sent in lipitor 40 mg q pm Patient to call pharmacy for cost. If also too expensive, patient to continue pravastatin 40 mg daily.

## 2013-11-02 NOTE — Telephone Encounter (Signed)
Tried to call patient but unable to LM.  VM not set up.  Please give message if she calls back. Sanjuanita Condrey,CMA

## 2013-11-02 NOTE — Telephone Encounter (Signed)
Crestor is $700 and she cannot afford it Please advise

## 2013-11-03 ENCOUNTER — Ambulatory Visit
Admission: RE | Admit: 2013-11-03 | Discharge: 2013-11-03 | Disposition: A | Discharge status: Home / Self Care | Payer: No Typology Code available for payment source | Source: Ambulatory Visit | Attending: Family Medicine | Admitting: Family Medicine

## 2013-11-03 ENCOUNTER — Other Ambulatory Visit: Payer: Self-pay | Admitting: Family Medicine

## 2013-11-03 DIAGNOSIS — R0789 Other chest pain: Secondary | ICD-10-CM

## 2013-11-22 ENCOUNTER — Other Ambulatory Visit: Payer: Self-pay | Admitting: Family Medicine

## 2013-12-08 ENCOUNTER — Encounter: Payer: Self-pay | Admitting: Family Medicine

## 2013-12-08 ENCOUNTER — Ambulatory Visit (INDEPENDENT_AMBULATORY_CARE_PROVIDER_SITE_OTHER): Payer: No Typology Code available for payment source | Admitting: Family Medicine

## 2013-12-08 VITALS — BP 112/64 | HR 84 | Temp 98.2°F | Wt 188.0 lb

## 2013-12-08 DIAGNOSIS — J069 Acute upper respiratory infection, unspecified: Secondary | ICD-10-CM | POA: Insufficient documentation

## 2013-12-08 DIAGNOSIS — I8390 Asymptomatic varicose veins of unspecified lower extremity: Secondary | ICD-10-CM

## 2013-12-08 DIAGNOSIS — B9789 Other viral agents as the cause of diseases classified elsewhere: Principal | ICD-10-CM

## 2013-12-08 MED ORDER — AZITHROMYCIN 500 MG PO TABS: 500.0000 mg | ORAL_TABLET | Freq: Every day | ORAL | Status: AC

## 2013-12-08 NOTE — Assessment & Plan Note (Signed)
You have a viral URI with cough. For this please do the following:  1. Drink plenty of fluids. Hot tea, soup etc will help open your nasal passages. 2. Mucinex/robitussin (guaifenessin) to break up secretions 3. Tylenol for sore throat and aches. 4. since your sons were recently diagnosed with and treated for pneumonia I will treat you with an antibiotic, azithromycin 500 mg once daily for 5 days.   Call and come back in  for chest pain, shortness of breath, persistent high fever.

## 2013-12-08 NOTE — Assessment & Plan Note (Signed)
A: varicose vein R upper thigh  P: Regarding your varicose veins, your pink card only covers [facility] and will not cover referrals. For now, I recommend home care as detailed below.

## 2013-12-08 NOTE — Progress Notes (Signed)
   Subjective:    Patient ID: Krystal EdwardsSiham Duran, female    DOB: 1971/01/15, 43 y.o.   MRN: [id number] CC: cough and sore throat  HPI 43 yo F with DM2 , cigar smoker presents for SD visit with 4 days of dry cough and sore throat. Also with myalgias. Feeling warm, no fevers. Anterior chest pain with cough and upper back burning. Sick contact with 44 yo and 755 yo sons recently dx with and treated for pneumonia. Her 44 year old son is here with her today and well appearing.  2. Varicose pain: R upper thigh. Painful during menses. Pain with walking up steps. LMP 12/07/13.  Soc hx: daily cigar smoker  Review of Systems As per HPI     Objective:   Physical Exam BP 112/64  Pulse 84  Temp(Src) 98.2 F (36.8 C) (Oral)  Wt 188 lb (85.276 kg)  SpO2 98%  LMP 12/07/2013 General appearance: alert, cooperative and no distress Head: Normocephalic, without obvious abnormality, atraumatic Eyes: negative findings: lids and lashes normal, corneas clear and pupils equal, round, reactive to light and accomodation, positive findings: conjunctiva: trace injection Ears: normal TM's and external ear canals both ears Nose: Nares normal. Septum midline. Mucosa normal. No drainage or sinus tenderness. Throat: normal findings: lips normal without lesions, buccal mucosa normal, teeth intact, non-carious and tongue midline and normal and abnormal findings: tonsillar hypertrophy 1+ Neck: mild anterior cervical adenopathy, no carotid bruit, no JVD, supple, symmetrical, trachea midline and thyroid not enlarged, symmetric, no tenderness/mass/nodules Lungs: clear to auscultation bilaterally Heart: regular rate and rhythm, S1, S2 normal, no murmur, click, rub or gallop Skin: enlarged varicose veins L upper posterior thigh, mild tenderness, no induration.       Assessment & Plan:

## 2013-12-08 NOTE — Patient Instructions (Signed)
Krystal Duran,  Thank you for coming in today.  You have a viral URI with cough. For this please do the following:  1. Drink plenty of fluids. Hot tea, soup etc will help open your nasal passages. 2. Mucinex/robitussin (guaifenessin) to break up secretions 3. Tylenol for sore throat and aches. 4. since your sons were recently diagnosed with and treated for pneumonia I will treat you with an antibiotic, azithromycin 500 mg once daily for 5 days.   Call and come back in  for chest pain, shortness of breath, persistent high fever.  Regarding your varicose veins, [facility] pink card [facility] and will not cover referrals. For now, I recommend home care as detailed below.   Dr. Armen PickupFunches   Varicose Veins Varicose veins are veins that have become enlarged and twisted. CAUSES This condition is the result of valves in the veins not working properly. Valves in the veins help return blood from the leg to the heart. If these valves are damaged, blood flows backwards and backs up into the veins in the leg near the skin. This causes the veins to become larger. People who are on their feet a lot, who are pregnant, or who are overweight are more likely to develop varicose veins. SYMPTOMS   Bulging, twisted-appearing, bluish veins, most commonly found on the legs.  Leg pain or a feeling of heaviness. These symptoms may be worse at the end of the day.  Leg swelling.  Skin color changes. DIAGNOSIS  Varicose veins can usually be diagnosed with an exam of your legs by your caregiver. He or she may recommend an ultrasound of your leg veins. TREATMENT  Most varicose veins can be treated at home.However, other treatments are available for people who have persistent symptoms or who want to treat the cosmetic appearance of the varicose veins. These include:  Laser treatment of very small varicose veins.  Medicine that is shot (injected) into the vein. This medicine  hardens the walls of the vein and closes off the vein. This treatment is called sclerotherapy. Afterwards, you may need to wear clothing or bandages that apply pressure.  Surgery. HOME CARE INSTRUCTIONS   Do not stand or sit in one position for long periods of time. Do not sit with your legs crossed. Rest with your legs raised during the day.  Wear elastic stockings or support hose. Do not wear other tight, encircling garments around the legs, pelvis, or waist.  Walk as much as possible to increase blood flow.  Raise the foot of your bed at night with 2-inch blocks.  If you get a cut in the skin over the vein and the vein bleeds, lie down with your leg raised and press on it with a clean cloth until the bleeding stops. Then place a bandage (dressing) on the cut. See your caregiver if it continues to bleed or needs stitches. SEEK MEDICAL CARE IF:   The skin around your ankle starts to break down.  You have pain, redness, tenderness, or hard swelling developing in your leg over a vein.  You are uncomfortable due to leg pain. Document Released: 03/25/2005 Document Revised: 09/07/2011 Document Reviewed: 08/11/2010 [facility] Patient Information 2014 [place].

## 2014-04-06 ENCOUNTER — Ambulatory Visit (INDEPENDENT_AMBULATORY_CARE_PROVIDER_SITE_OTHER): Payer: Self-pay | Admitting: *Deleted

## 2014-04-06 DIAGNOSIS — Z23 Encounter for immunization: Secondary | ICD-10-CM

## 2014-04-06 DIAGNOSIS — Z111 Encounter for screening for respiratory tuberculosis: Secondary | ICD-10-CM

## 2014-04-06 NOTE — Progress Notes (Signed)
   PPD placed Left Forearm.  Pt to return 04/09/14 at 9 AM for reading.  Pt tolerated intradermal injection. Clovis PuMartin, Cristin Penaflor L, RN

## 2014-04-09 ENCOUNTER — Encounter: Payer: Self-pay | Admitting: *Deleted

## 2014-04-09 ENCOUNTER — Ambulatory Visit (INDEPENDENT_AMBULATORY_CARE_PROVIDER_SITE_OTHER): Payer: No Typology Code available for payment source | Admitting: *Deleted

## 2014-04-09 DIAGNOSIS — Z111 Encounter for screening for respiratory tuberculosis: Secondary | ICD-10-CM

## 2014-04-09 LAB — TB SKIN TEST
INDURATION: 0 mm
TB Skin Test: NEGATIVE

## 2014-04-09 NOTE — Progress Notes (Signed)
   PPD Reading Note PPD read and results entered in EpicCare. Result: 0 mm induration. Interpretation: negative Martin, Tamika L, RN  

## 2014-04-30 ENCOUNTER — Encounter: Payer: Self-pay | Admitting: Family Medicine

## 2014-06-25 ENCOUNTER — Ambulatory Visit (INDEPENDENT_AMBULATORY_CARE_PROVIDER_SITE_OTHER): Payer: Self-pay | Admitting: Family Medicine

## 2014-06-25 ENCOUNTER — Encounter: Payer: Self-pay | Admitting: Family Medicine

## 2014-06-25 VITALS — BP 134/83 | HR 87 | Temp 98.5°F | Ht 66.0 in | Wt 187.8 lb

## 2014-06-25 DIAGNOSIS — R6889 Other general symptoms and signs: Secondary | ICD-10-CM

## 2014-06-25 MED ORDER — OSELTAMIVIR PHOSPHATE 75 MG PO CAPS: 75.0000 mg | ORAL_CAPSULE | Freq: Two times a day (BID) | ORAL | Status: DC

## 2014-06-25 NOTE — Patient Instructions (Addendum)
Take tamiflu twice a day for 5 days Use tylenol, ibuprofen, saline nasal spray as needed Return if not getting better in a few days  Schedule an appointment to meet Dr. Birdie SonsSonnenberg and follow up on your diabetes.  Be well, Dr. Pollie MeyerMcIntyre    Influenza Influenza ("the flu") is a viral infection of the respiratory tract. It occurs more often in winter months because people spend more time in close contact with one another. Influenza can make you feel very sick. Influenza easily spreads from person to person (contagious). CAUSES  Influenza is caused by a virus that infects the respiratory tract. You can catch the virus by breathing in droplets from an infected person's cough or sneeze. You can also catch the virus by touching something that was recently contaminated with the virus and then touching your mouth, nose, or eyes. RISKS AND COMPLICATIONS You may be at risk for a more severe case of influenza if you smoke cigarettes, have diabetes, have chronic heart disease (such as heart failure) or lung disease (such as asthma), or if you have a weakened immune system. Elderly people and pregnant women are also at risk for more serious infections. The most common problem of influenza is a lung infection (pneumonia). Sometimes, this problem can require emergency medical care and may be life threatening. SIGNS AND SYMPTOMS  Symptoms typically last 4 to 10 days and may include:  Fever.  Chills.  Headache, body aches, and muscle aches.  Sore throat.  Chest discomfort and cough.  Poor appetite.  Weakness or feeling tired.  Dizziness.  Nausea or vomiting. DIAGNOSIS  Diagnosis of influenza is often made based on your history and a physical exam. A nose or throat swab test can be done to confirm the diagnosis. TREATMENT  In mild cases, influenza goes away on its own. Treatment is directed at relieving symptoms. For more severe cases, your health care provider may prescribe antiviral medicines to  shorten the sickness. Antibiotic medicines are not effective because the infection is caused by a virus, not by bacteria. HOME CARE INSTRUCTIONS  Take medicines only as directed by your health care provider.  Use a cool mist humidifier to make breathing easier.  Get plenty of rest until your temperature returns to normal. This usually takes 3 to 4 days.  Drink enough fluid to keep your urine clear or pale yellow.  Cover yourmouth and nosewhen coughing or sneezing,and wash your handswellto prevent thevirusfrom spreading.  Stay homefromwork orschool untilthe fever is gonefor at least 11full day. PREVENTION  An annual influenza vaccination (flu shot) is the best way to avoid getting influenza. An annual flu shot is now routinely recommended for all adults in the [place] SEEK MEDICAL CARE IF:  You experiencechest pain, yourcough worsens,or you producemore mucus.  Youhave nausea,vomiting, ordiarrhea.  Your fever returns or gets worse. SEEK IMMEDIATE MEDICAL CARE IF:  You havetrouble breathing, you become short of breath,or your skin ornails becomebluish.  You have severe painor stiffnessin the neck.  You develop a sudden headache, or pain in the face or ear.  You have nausea or vomiting that you cannot control. MAKE SURE YOU:   Understand these instructions.  Will watch your condition.  Will get help right away if you are not doing well or get worse. Document Released: 06/12/2000 Document Revised: 10/30/2013 Document Reviewed: 09/14/2011 [facility] Patient Information 2015 [place], [place]. This information is not intended to replace advice given to you by your health care provider. Make sure you discuss any questions you  have with your health care provider.  

## 2014-06-29 NOTE — Progress Notes (Signed)
Patient ID: Krystal Duran, female   DOB: Apr 14, 1971, 44 y.o.   MRN: [id number]  HPI:  Pt presents for a same day appointment to discuss possible flu.  Pt states that about 3 days ago began to feel ill. Has experienced headache, sore throat, muscle aches, cough. Has vomited twice. Had fever to 102 a few days ago, since then has been afebrile. Urinating and stooling normally. Has had decreased appetite and decreased PO intake in general but able to tolerate fluids. Has taken nyquil and excedrin without significant relief.   ROS: See HPI  PMFSH: hx HTN, DM, NASH  PHYSICAL EXAM: BP 134/83 mmHg  Pulse 87  Temp(Src) 98.5 F (36.9 C) (Oral)  Ht  (1.676 m)  Wt 187 lb 12.8 oz (85.186 kg)  BMI 30.33 kg/m2  LMP 05/26/2014 Gen: NAD, pleasant and cooperative. Interactive.  HEENT: NCAT, MMM, oropharynx clear and moist without exduate. Shoddy anterior cervical lymphadenopathy. Nares patent. No meningeal signs, neck supple. Heart: RRR no murmurs Lungs: CTAB, NWOB Abdomen: soft, NTTP Neuro: grossly nonfocal, speech normal Skin: no rashes noted  ASSESSMENT/PLAN:  1. Possible flu - sx's consistent with possible flu. Appears well hydrated today, without signs of bacterial superinfection on exam. Given her hx of DM, she is at risk for complications of flu and thus tamiflu is indicated. Will send in rx for 5 day course. Also recommend supportive care with tylenol, ibuprofen, saline nasal spray. F/u if not improving in the next several days  2. Chronic medical problems - DM, HTN - has not followed up recently to meet new PCP. Encouraged pt to schedule appt with PCP Dr. Birdie Sons to f/u on these problems in the near future.  FOLLOW UP: F/u as needed if symptoms worsen or do not improve.  Schedule appt with PCP for chronic medical problems.  [place] J. Pollie Meyer, MD [facility]

## 2014-07-03 ENCOUNTER — Encounter: Payer: Self-pay | Admitting: Family Medicine

## 2014-07-03 ENCOUNTER — Ambulatory Visit (INDEPENDENT_AMBULATORY_CARE_PROVIDER_SITE_OTHER): Payer: Self-pay | Admitting: Family Medicine

## 2014-07-03 VITALS — BP 128/82 | HR 80 | Temp 98.2°F | Ht 66.0 in | Wt 188.0 lb

## 2014-07-03 DIAGNOSIS — IMO0002 Reserved for concepts with insufficient information to code with codable children: Secondary | ICD-10-CM

## 2014-07-03 DIAGNOSIS — E781 Pure hyperglyceridemia: Secondary | ICD-10-CM

## 2014-07-03 DIAGNOSIS — E1165 Type 2 diabetes mellitus with hyperglycemia: Secondary | ICD-10-CM

## 2014-07-03 DIAGNOSIS — M7711 Lateral epicondylitis, right elbow: Secondary | ICD-10-CM

## 2014-07-03 LAB — POCT GLYCOSYLATED HEMOGLOBIN (HGB A1C): Hemoglobin A1C: 7.7

## 2014-07-03 MED ORDER — ATORVASTATIN CALCIUM 40 MG PO TABS: 40.0000 mg | ORAL_TABLET | Freq: Every day | ORAL | Status: AC

## 2014-07-03 MED ORDER — TENNIS ELBOW STRAP MISC: 1.0000 | Freq: Every day | Status: AC

## 2014-07-03 MED ORDER — GLIPIZIDE 10 MG PO TABS: ORAL_TABLET | ORAL | Status: DC

## 2014-07-03 NOTE — Patient Instructions (Signed)
I have refilled your medication.  Please try the brace and ice for your elbow pain. If this does not improve please let us know and we can obtain an x-ray.

## 2014-07-04 ENCOUNTER — Encounter: Payer: Self-pay | Admitting: Family Medicine

## 2014-07-04 DIAGNOSIS — M7711 Lateral epicondylitis, right elbow: Secondary | ICD-10-CM | POA: Insufficient documentation

## 2014-07-04 DIAGNOSIS — E781 Pure hyperglyceridemia: Secondary | ICD-10-CM | POA: Insufficient documentation

## 2014-07-04 DIAGNOSIS — E785 Hyperlipidemia, unspecified: Secondary | ICD-10-CM

## 2014-07-04 HISTORY — DX: Hyperlipidemia, unspecified: E78.5

## 2014-07-04 NOTE — Assessment & Plan Note (Signed)
Tolerating statin. Will plan to check lipid panel at next visit.

## 2014-07-04 NOTE — Assessment & Plan Note (Signed)
A1c improved. Will continue current glipizide dosing. To continue to diet and exercise.

## 2014-07-04 NOTE — Addendum Note (Signed)
Addended by: Birdie SonsSONNENBERG, ERIC G on: 07/04/2014 02:26 PM   Modules accepted: Orders, Medications

## 2014-07-04 NOTE — Progress Notes (Signed)
Patient ID: Krystal Duran, female   DOB: 06-11-1971, 44 y.o.   MRN: [id number]  Krystal AlarEric Kevonte Vanecek, MD Phone: [phone]  Krystal EdwardsSiham Duran is a 44 y.o. female who presents today for f/u.  DIABETES Disease Monitoring: Blood Sugar ranges-last check was 184 Polyuria/phagia/dipsia- has had increased polyuria since she has not been taking medication for the past couple of weeks. Has been trying to lose weight.     Medications: Compliance- taking until 3 weeks ago when she ran out of glipizide Hypoglycemic symptoms- no  HLD: is taking lipitor up until she ran out. Is tolerating well with not muscle aches and abdominal pain.   Right elbow pain: notes she hit her elbow one month ago. Has had pain at elbow with squeezing of her right hand since that time. Has full ROM of elbow with out pain. Took ibuprofen for this and this helped. No swelling or erythema of the area. There is some pain with palpation of the lateral aspect of the elbow.    Patient is a smoker.   ROS: Per HPI   Physical Exam Filed Vitals:   07/03/14 1633  BP: 128/82  Pulse:   Temp:     Gen: Well NAD HEENT: PERRL,  MMM Lungs: CTABL Nl WOB Heart: RRR  MSK: mild TTP over the insertion site of the extensor muscles on the lateral aspect of the lateral epicondyle, discomfort with resisted supination, normal ROM, normal left elbow Neuro: 5/5 strength in bilateral biceps, triceps, grip, sensation to light touch intact in bilateral UE  Exts: Non edematous BL  LE, warm and well perfused.    Assessment/Plan: Please see individual problem list.  Krystal AlarEric Brina Umeda, MD [facility] PGY-3

## 2014-07-04 NOTE — Assessment & Plan Note (Signed)
Patient with exam findings of and symptoms of tennis elbow. No bony tenderness to indicate fracture. Will treat with ice and bracing. Prescription for brace given. If not improving with this she will return for f/u.

## 2014-07-25 ENCOUNTER — Ambulatory Visit (INDEPENDENT_AMBULATORY_CARE_PROVIDER_SITE_OTHER): Payer: Self-pay | Admitting: Family Medicine

## 2014-07-25 ENCOUNTER — Encounter: Payer: Self-pay | Admitting: Family Medicine

## 2014-07-25 VITALS — BP 120/84 | HR 90 | Temp 98.4°F | Wt 184.0 lb

## 2014-07-25 DIAGNOSIS — R0781 Pleurodynia: Secondary | ICD-10-CM | POA: Insufficient documentation

## 2014-07-25 DIAGNOSIS — R1010 Upper abdominal pain, unspecified: Secondary | ICD-10-CM

## 2014-07-25 LAB — POCT URINE PREGNANCY: Preg Test, Ur: NEGATIVE

## 2014-07-25 NOTE — Patient Instructions (Signed)
Nice to see you. We are going to check some blood work and will call with the results.  Please go get the X-ray of your abdomen.  We will schedule you an appointment for a US of your abdomen as well.   If your paiKorean worsens, does not go away, your develop fevers, vomiting, or inability to keep fluids down please seek medical attention.   Abdominal Pain Many things can cause abdominal pain. Usually, abdominal pain is not caused by a disease and will improve without treatment. It can often be observed and treated at home. Your health care provider will do a physical exam and possibly order blood tests and X-rays to help determine the seriousness of your pain. However, in many cases, more time must pass before a clear cause of the pain can be found. Before that point, your health care provider may not know if you need more testing or further treatment. HOME CARE INSTRUCTIONS  Monitor your abdominal pain for any changes. The following actions may help to alleviate any discomfort you are experiencing:  Only take over-the-counter or prescription medicines as directed by your health care provider.  Do not take laxatives unless directed to do so by your health care provider.  Try a clear liquid diet (broth, tea, or water) as directed by your health care provider. Slowly move to a bland diet as tolerated. SEEK MEDICAL CARE IF:  You have unexplained abdominal pain.  You have abdominal pain associated with nausea or diarrhea.  You have pain when you urinate or have a bowel movement.  You experience abdominal pain that wakes you in the night.  You have abdominal pain that is worsened or improved by eating food.  You have abdominal pain that is worsened with eating fatty foods.  You have a fever. SEEK IMMEDIATE MEDICAL CARE IF:   Your pain does not go away within 2 hours.  You keep throwing up (vomiting).  Your pain is felt only in portions of the abdomen, such as the right side or the left  lower portion of the abdomen.  You pass bloody or black tarry stools. MAKE SURE YOU:  Understand these instructions.   Will watch your condition.   Will get help right away if you are not doing well or get worse.  Document Released: 03/25/2005 Document Revised: 06/20/2013 Document Reviewed: 02/22/2013 [facility] Patient Information 2015 [place]ExitCare, MarylandLLC. This information is not intended to replace advice given to you by your health care provider. Make sure you discuss any questions you have with your health care provider.

## 2014-07-25 NOTE — Progress Notes (Signed)
Patient ID: Krystal EdwardsSiham Duran, female   DOB: 1971-04-23, 44 y.o.   MRN: [id number]  Krystal AlarEric Sonnenberg, MD Phone: [phone]  Krystal Duran is a 44 y.o. female who presents today for f/u.  Abdominal pain: patient notes bilateral upper abdominal pain for the past 2 weeks. She notes this feels tight and hard. It feels "restricted." She also feels movement in her upper abdomen. There is no bloating. There is nausea. The pain is worse with eating. Denies fevers, vomiting, and diarrhea. Has not had this type of pain previously. She notes she had a period 2 weeks ago. Has her tubes tied. Had a small BM daily that is soft. She notes the pain did not improve with stopping her statin. Denies alcohol use. Patient has a history of liver steatosis.   Patient is a smoker.   ROS: Per HPI   Physical Exam Filed Vitals:   07/25/14 1352  BP: 120/84  Pulse: 90  Temp: 98.4 F (36.9 C)    Gen: Well NAD Lungs: CTABL Nl WOB Heart: RRR  Abd: soft, TTP in RUQ, LUQ, and episgastric region, no guarding or rebound, negative murphy sign, ND, no skin findings, liver edge felt 2 finger widths down from costal margin, spleen not palpable   Assessment/Plan: Please see individual problem list.  Krystal AlarEric Sonnenberg, MD [facility] PGY-3

## 2014-07-25 NOTE — Assessment & Plan Note (Signed)
Patient with upper abdominal pain for 2 weeks. Sounds like she has small BMs. Also has a history of NASH. Murphy sign negative making acute gall bladder pathology unlikely. Considered constipation, NASH, gall bladder pathology, gastritis, other liver pathology, and pregnancy. Will obtain KUB, abd US, cmet, and upreg. Will determine treatment plan once results to testing returns. Given return precautions in AVS.   Precepted with Dr Jennette KettleNeal and Dr Gwendolyn GrantWalden

## 2014-07-26 ENCOUNTER — Telehealth: Payer: Self-pay | Admitting: *Deleted

## 2014-07-26 LAB — COMPREHENSIVE METABOLIC PANEL
ALT: 10 U/L (ref 0–35)
AST: 14 U/L (ref 0–37)
Albumin: 4 g/dL (ref 3.5–5.2)
Alkaline Phosphatase: 68 U/L (ref 39–117)
BUN: 14 mg/dL (ref 6–23)
CO2: 26 meq/L (ref 19–32)
CREATININE: 0.52 mg/dL (ref 0.50–1.10)
Calcium: 9.1 mg/dL (ref 8.4–10.5)
Chloride: 103 mEq/L (ref 96–112)
Glucose, Bld: 148 mg/dL — ABNORMAL HIGH (ref 70–99)
Potassium: 3.9 mEq/L (ref 3.5–5.3)
Sodium: 135 mEq/L (ref 135–145)
TOTAL PROTEIN: 7.2 g/dL (ref 6.0–8.3)
Total Bilirubin: 0.3 mg/dL (ref 0.2–1.2)

## 2014-07-26 NOTE — Telephone Encounter (Signed)
LM for patient ok per ROI with normal results. Caci Orren,CMA

## 2014-07-26 NOTE — Telephone Encounter (Signed)
-----   Message from Glori LuisEric G Sonnenberg, MD sent at 07/26/2014  8:49 AM EST ----- Please call and inform the patient that her liver function tests were normal. Thanks.

## 2014-07-30 ENCOUNTER — Ambulatory Visit (HOSPITAL_COMMUNITY)
Admission: RE | Admit: 2014-07-30 | Discharge: 2014-07-30 | Disposition: A | Discharge status: Home / Self Care | Payer: Self-pay | Source: Ambulatory Visit | Attending: Family Medicine | Admitting: Family Medicine

## 2014-07-30 DIAGNOSIS — R1010 Upper abdominal pain, unspecified: Secondary | ICD-10-CM

## 2014-07-30 DIAGNOSIS — R1011 Right upper quadrant pain: Secondary | ICD-10-CM | POA: Insufficient documentation

## 2014-07-30 DIAGNOSIS — K760 Fatty (change of) liver, not elsewhere classified: Secondary | ICD-10-CM | POA: Insufficient documentation

## 2014-08-01 ENCOUNTER — Telehealth: Payer: Self-pay | Admitting: Family Medicine

## 2014-08-01 NOTE — Telephone Encounter (Signed)
Attempted to call patient about US results. It rang once then went to a busy signal. Her US revealed the known fatty liver, though no other abnormalities. Given normal CMET the liver is less likely to be the cause of her discomfort. She should go get the X-ray of her abdomen to further evaluate this issue as we discussed at her visit last week. Please attempt to call her back to inform her of this. Thanks.

## 2014-08-02 ENCOUNTER — Telehealth: Payer: Self-pay | Admitting: Family Medicine

## 2014-08-02 NOTE — Telephone Encounter (Signed)
Tried calling patient again and number is still not working. Krystal Duran,CMA

## 2014-08-02 NOTE — Telephone Encounter (Signed)
Unable to reach patient.  Phone rang once and then it signaled busy. Jazmin Hartsell,CMA

## 2014-08-02 NOTE — Telephone Encounter (Signed)
Pt would like to speak to Krystal Duran concerning her US results. Please call [phone]. jw

## 2014-08-02 NOTE — Telephone Encounter (Signed)
Left detailed message (ok per ROI in chart) with u/s results.  Advised her to get xray and that we would call with results.  Jazmin Hartsell,CMA

## 2014-08-02 NOTE — Telephone Encounter (Signed)
Please call pt back. Number is correct -

## 2014-08-06 NOTE — Telephone Encounter (Signed)
Spoke with patient regarding xray.  She wasn't sure if she would need an appt.  [facility]  We will call her with results.  Jazmin Hartsell,CMA

## 2014-08-07 ENCOUNTER — Ambulatory Visit
Admission: RE | Admit: 2014-08-07 | Discharge: 2014-08-07 | Disposition: A | Discharge status: Home / Self Care | Payer: Self-pay | Source: Ambulatory Visit | Attending: Family Medicine | Admitting: Family Medicine

## 2014-08-07 DIAGNOSIS — R1010 Upper abdominal pain, unspecified: Secondary | ICD-10-CM

## 2014-08-15 ENCOUNTER — Encounter: Payer: Self-pay | Admitting: Family Medicine

## 2014-08-28 ENCOUNTER — Ambulatory Visit (INDEPENDENT_AMBULATORY_CARE_PROVIDER_SITE_OTHER): Payer: Self-pay | Admitting: Family Medicine

## 2014-08-28 ENCOUNTER — Encounter: Payer: Self-pay | Admitting: Family Medicine

## 2014-08-28 VITALS — BP 131/81 | HR 91 | Temp 98.4°F | Wt 184.0 lb

## 2014-08-28 DIAGNOSIS — R0781 Pleurodynia: Secondary | ICD-10-CM

## 2014-08-28 DIAGNOSIS — K219 Gastro-esophageal reflux disease without esophagitis: Secondary | ICD-10-CM

## 2014-08-28 MED ORDER — OMEPRAZOLE 20 MG PO CPDR: 20.0000 mg | DELAYED_RELEASE_CAPSULE | Freq: Every day | ORAL | Status: AC

## 2014-08-28 NOTE — Patient Instructions (Signed)
Nice to see you. Your pain is likely related to a spasm or pulled muscle in your ribs. We will get an x-ray of your ribs to ensure that there is no fracture.  Please try over the counter naproxen 1 tablet twice a day for 7 days. Then as needed.  You can alternate heat and ice to the area. If you have shortness of breath, chest pain, abdominal pain, vomiting, diarrhea please seek medical attention.

## 2014-08-29 ENCOUNTER — Encounter: Payer: Self-pay | Admitting: Family Medicine

## 2014-08-29 DIAGNOSIS — K219 Gastro-esophageal reflux disease without esophagitis: Secondary | ICD-10-CM

## 2014-08-29 HISTORY — DX: Gastro-esophageal reflux disease without esophagitis: K21.9

## 2014-08-29 NOTE — Progress Notes (Signed)
Patient ID: Krystal Duran, female   DOB: 1970-11-15, 44 y.o.   MRN: [id number]  Krystal AlarEric Sonia Bromell, MD Phone: [phone]  Krystal Duran is a 44 y.o. female who presents today for f/u.  Right sided pain: patient reports continued discomfort. This pain was previously reported as abdominal pain, though she now states this was never in her abdomen and was actually in her lower ribs. Mostly on the right side, though mild on left as well. She notes the pain is worse with reaching with her right arm and twisting. She states she has been lifting lots of heavy stuff recently and this may have exacerbated this. No specific injury. No cough. No vomiting or diarrhea. No constipation. No vaginal discharge. Not associated with food.   GERD: does note some burning reflux. No sour taste. Has had this previously. Does not associate this with any food. States it happens after she drinks water. Is not currently on medication for this.   Patient is a smoker.   ROS: Per HPI   Physical Exam Filed Vitals:   08/28/14 1615  BP: 131/81  Pulse: 91  Temp: 98.4 F (36.9 C)    Gen: Well NAD Lungs: CTABL Nl WOB Heart: RRR  Abd: soft, NT, ND MSK: bilateral lower ribs with tenderness to palpation, no bruising noted Exts: Non edematous BL  LE, warm and well perfused.    Assessment/Plan: Please see individual problem list.  Krystal AlarEric Amerigo Mcglory, MD [facility] PGY-3

## 2014-08-29 NOTE — Assessment & Plan Note (Signed)
Patient with symptoms of GERD. Will trial omeprazole for one month. Given return precautions.

## 2014-08-29 NOTE — Assessment & Plan Note (Addendum)
Patient with pain in ribs and likely rib strain as cause of discomfort she has been having for the past month. She notes no abdominal pain. She had US and KUB to evaluate abdomen with fatty liver seen and no other abnormalities. Suspect this has been related to rib strain given tenderness on exam. Doubt rib fracture with lack of trauma. Doubt pulmonary process with normal HR, O2 sat, and lung exam. Will trial aleve BID for one week. Obtain XR ribs to rule out fracture. Given return precautions.

## 2014-09-03 ENCOUNTER — Ambulatory Visit: Payer: Self-pay | Admitting: Family Medicine

## 2014-09-12 ENCOUNTER — Other Ambulatory Visit: Payer: Self-pay

## 2014-09-12 DIAGNOSIS — Z1231 Encounter for screening mammogram for malignant neoplasm of breast: Secondary | ICD-10-CM

## 2014-09-14 ENCOUNTER — Ambulatory Visit: Payer: Self-pay

## 2014-10-05 ENCOUNTER — Ambulatory Visit: Payer: Self-pay

## 2014-11-23 ENCOUNTER — Ambulatory Visit
Admission: RE | Admit: 2014-11-23 | Discharge: 2014-11-23 | Disposition: A | Discharge status: Home / Self Care | Payer: Medicaid Other | Source: Ambulatory Visit

## 2014-11-23 DIAGNOSIS — Z1231 Encounter for screening mammogram for malignant neoplasm of breast: Secondary | ICD-10-CM

## 2015-01-29 ENCOUNTER — Other Ambulatory Visit: Payer: Self-pay | Admitting: Family Medicine

## 2015-01-29 NOTE — Telephone Encounter (Signed)
Last OV 3.1.16.  Please advise refill

## 2015-01-30 ENCOUNTER — Other Ambulatory Visit: Payer: Self-pay | Admitting: Family Medicine

## 2015-01-31 NOTE — Telephone Encounter (Signed)
Please advise refill as Dr. Birdie Sons is in a different office now. Thanks Education administrator

## 2015-01-31 NOTE — Telephone Encounter (Signed)
Medication Refilled. Patient has not been seen since January 2016. Please have her make a follow up appointment. Thank you!

## 2015-01-31 NOTE — Telephone Encounter (Signed)
Letter mailed to patient. Krystal Duran,CMA  

## 2015-10-21 ENCOUNTER — Other Ambulatory Visit: Payer: Self-pay

## 2015-10-21 DIAGNOSIS — Z1231 Encounter for screening mammogram for malignant neoplasm of breast: Secondary | ICD-10-CM

## 2015-11-26 ENCOUNTER — Ambulatory Visit
Admission: RE | Admit: 2015-11-26 | Discharge: 2015-11-26 | Disposition: A | Discharge status: Home / Self Care | Payer: Medicaid Other | Source: Ambulatory Visit

## 2015-11-26 DIAGNOSIS — Z1231 Encounter for screening mammogram for malignant neoplasm of breast: Secondary | ICD-10-CM

## 2015-12-04 ENCOUNTER — Emergency Department (HOSPITAL_COMMUNITY): Payer: Medicaid Other

## 2015-12-04 ENCOUNTER — Encounter (HOSPITAL_COMMUNITY): Payer: Self-pay | Admitting: Emergency Medicine

## 2015-12-04 ENCOUNTER — Emergency Department (HOSPITAL_COMMUNITY)
Admission: EM | Admit: 2015-12-04 | Discharge: 2015-12-05 | Disposition: A | Discharge status: Home / Self Care | Payer: Medicaid Other | Attending: Emergency Medicine | Admitting: Emergency Medicine

## 2015-12-04 DIAGNOSIS — F17210 Nicotine dependence, cigarettes, uncomplicated: Secondary | ICD-10-CM | POA: Diagnosis not present

## 2015-12-04 DIAGNOSIS — I10 Essential (primary) hypertension: Secondary | ICD-10-CM | POA: Diagnosis not present

## 2015-12-04 DIAGNOSIS — R1011 Right upper quadrant pain: Secondary | ICD-10-CM | POA: Diagnosis not present

## 2015-12-04 DIAGNOSIS — Z79899 Other long term (current) drug therapy: Secondary | ICD-10-CM | POA: Diagnosis not present

## 2015-12-04 DIAGNOSIS — G8929 Other chronic pain: Secondary | ICD-10-CM | POA: Diagnosis not present

## 2015-12-04 DIAGNOSIS — R1013 Epigastric pain: Secondary | ICD-10-CM | POA: Diagnosis not present

## 2015-12-04 DIAGNOSIS — Z7984 Long term (current) use of oral hypoglycemic drugs: Secondary | ICD-10-CM | POA: Insufficient documentation

## 2015-12-04 DIAGNOSIS — E119 Type 2 diabetes mellitus without complications: Secondary | ICD-10-CM | POA: Insufficient documentation

## 2015-12-04 DIAGNOSIS — R071 Chest pain on breathing: Secondary | ICD-10-CM | POA: Diagnosis present

## 2015-12-04 DIAGNOSIS — Z7982 Long term (current) use of aspirin: Secondary | ICD-10-CM | POA: Diagnosis not present

## 2015-12-04 LAB — BASIC METABOLIC PANEL
Anion gap: 7 (ref 5–15)
BUN: 14 mg/dL (ref 6–20)
CALCIUM: 9.4 mg/dL (ref 8.9–10.3)
CO2: 27 mmol/L (ref 22–32)
CREATININE: 0.48 mg/dL (ref 0.44–1.00)
Chloride: 102 mmol/L (ref 101–111)
GFR calc Af Amer: >60 mL/min (ref >60)
GFR calc non Af Amer: >60 mL/min (ref >60)
GLUCOSE: 108 mg/dL — AB (ref 65–99)
Potassium: 3.8 mmol/L (ref 3.5–5.1)
SODIUM: 136 mmol/L (ref 135–145)

## 2015-12-04 LAB — CBC
HCT: 39.3 % (ref 36.0–46.0)
Hemoglobin: 13.4 g/dL (ref 12.0–15.0)
MCH: 25.3 pg — ABNORMAL LOW (ref 26.0–34.0)
MCHC: 34.1 g/dL (ref 30.0–36.0)
MCV: 74.3 fL — ABNORMAL LOW (ref 78.0–100.0)
PLATELETS: 287 10*3/uL (ref 150–400)
RBC: 5.29 MIL/uL — ABNORMAL HIGH (ref 3.87–5.11)
RDW: 20 % — ABNORMAL HIGH (ref 11.5–15.5)
WBC: 9.5 10*3/uL (ref 4.0–10.5)

## 2015-12-04 LAB — I-STAT TROPONIN, ED: Troponin i, poc: 0 ng/mL (ref 0.00–0.08)

## 2015-12-04 NOTE — ED Provider Notes (Signed)
CSN: [id number]     Arrival date & time 12/04/15  2207 History  By signing my name below, I, Krystal Duran, attest that this documentation has been prepared under the direction and in the presence of Shon Batonourtney F Horton, MD. Electronically Signed: Randell PatientMarrissa Duran, ED Scribe. 12/05/2015. 2:04 AM.   Chief Complaint  Patient presents with  . Chest Pain  . Abdominal Pain  . Nausea   The history is provided by the patient. No language interpreter was used.   HPI Comments: Krystal Duran is a 45 y.o. female with an hx of HTN, anemia, HLD, and GERD who presents to the Emergency Department complaining of constant, gradually worsening, 6/10 RUQ abdominal pain ongoing for the past 6 months, worse in the past 2 days. Pt states that she has been followed by her PCP Dr[facility] her [facility] pain and who recently referred her out to a GI specialist for further evaluation and advised her to present to the ED if her pain worsened. She reports burning, left-sided CP today, abdominal bloating, and nausea. Pain is worse with movement, deep breathing, and eating. She has not taken any pain medications or attempted any treatments. She notes that she has had ultrasounds of her abdomen in the past that revealed that she had a fatty liver. Patient reports that she takes iron supplements daily. Denies hx of cholecystitis. Denies vomiting, dysuria, hematuria, or any other symptoms currently.  Past Medical History  Diagnosis Date  . Arthritis 2005 and 2009    Pregnancy induced   . Gestational diabetes 2009    Had to take oral hypoglycemic   . Hypertension 2009     Gestational HTN, had to take medication in 2009  . Costochondritis 09/11/2011  . Fatigue 12/19/2010  . Breast pain in female 12/19/2010  . Anemia 07/10/2013  . Anemia 07/10/2013  . HLD (hyperlipidemia) 07/04/2014  . GERD (gastroesophageal reflux disease) 08/29/2014   Past Surgical History  Procedure Laterality Date  . Lasik   2009  . Tubal ligation  2000   Family History  Problem Relation Age of Onset  . Hypertension Mother   . Hyperlipidemia Mother   . Arthritis Mother     knees  . Hyperlipidemia Father   . Stroke Father [id number]  . Hyperlipidemia Brother   . Hypertension Maternal Aunt   . Cancer Cousin 38    Vaginal cancer  . Arthritis Sister     knees  . Migraines Sister   . Migraines Sister    Social History  Substance Use Topics  . Smoking status: Light Tobacco Smoker -- 0.10 packs/day for 2 years    Types: Cigarettes, Cigars  . Smokeless tobacco: Never Used  . Alcohol Use: No   OB History    Gravida Para Term Preterm AB TAB SAB Ectopic Multiple Living   5 5 5  0 0 0 0 0 0 5      Obstetric Comments   Gestational HTN-2009 and 2010 Gestational diabetes- 2010     Review of Systems  Constitutional: Negative for fever.  Cardiovascular: Positive for chest pain.  Gastrointestinal: Positive for nausea and abdominal pain. Negative for vomiting.  Genitourinary: Positive for decreased urine volume. Negative for dysuria and hematuria.  All other systems reviewed and are negative.     Allergies  Metformin and related and Motrin  Home Medications   Prior to Admission medications   Medication Sig Start Date End Date Taking? Authorizing Provider  aspirin-acetaminophen-caffeine (EXCEDRIN MIGRAINE) [phone] MG  tablet Take 2 tablets by mouth every 6 (six) hours as needed for headache.   Yes Historical Provider, MD  ferrous sulfate 325 (65 FE) MG tablet Take 325 mg by mouth 3 (three) times daily. 10/18/15  Yes Historical Provider, MD  glipiZIDE (GLUCOTROL) 5 MG tablet Take 5 mg by mouth daily. 07/15/15  Yes Historical Provider, MD  metroNIDAZOLE (METROGEL) 1 % gel Apply topically daily. Apply to forehead and cheeks Patient taking differently: Apply 1 application topically 2 (two) times daily. Apply to forehead and cheeks 10/31/13  Yes Josalyn Funches, MD  Omega-3 Fatty Acids (FISH OIL) 600 MG CAPS Take 1  capsule by mouth 4 (four) times daily.    Yes Historical Provider, MD  omeprazole (PRILOSEC) 20 MG capsule Take 1 capsule (20 mg total) by mouth daily. 08/28/14  Yes Glori Luis, MD  pantoprazole (PROTONIX) 40 MG tablet Take 40 mg by mouth daily. 10/18/15 10/17/16 Yes Historical Provider, MD  aspirin EC 81 MG tablet Take 1 tablet (81 mg total) by mouth daily. Patient not taking: Reported on 12/04/2015 02/09/13   Dessa Phi, MD  atorvastatin (LIPITOR) 40 MG tablet Take 1 tablet (40 mg total) by mouth daily. Patient not taking: Reported on 12/04/2015 07/03/14   Glori Luis, MD  Elastic Bandages & Supports (TENNIS ELBOW STRAP) MISC 1 Device by Does not apply route daily. Patient not taking: Reported on 12/04/2015 07/03/14   Glori Luis, MD  glipiZIDE (GLUCOTROL) 10 MG tablet TAKE ONE TABLET BY MOUTH TWICE DAILY BEFORE MEAL(S) Patient not taking: Reported on 12/04/2015 01/31/15   Palma Holter, MD  HYDROcodone-acetaminophen (NORCO/VICODIN) 5-325 MG tablet Take 1 tablet by mouth every 6 (six) hours as needed. 12/05/15   Shon Baton, MD   BP 156/80 mmHg  Pulse 60  Temp(Src) 97.7 F (36.5 C) (Oral)  Resp 16  SpO2 100%  LMP 11/18/2015 (Approximate) Physical Exam  Constitutional: She is oriented to person, place, and time. She appears well-developed and well-nourished.  HENT:  Head: Normocephalic and atraumatic.  Cardiovascular: Normal rate, regular rhythm and normal heart sounds.   Pulmonary/Chest: Effort normal. No respiratory distress. She has no wheezes.  Abdominal: Soft. Bowel sounds are normal. There is tenderness. There is no rebound and no guarding.  Minimal epigastric right upper quadrant tenderness palpation without rebound or guarding  Neurological: She is alert and oriented to person, place, and time.  Skin: Skin is warm and dry.  Psychiatric: She has a normal mood and affect.  Nursing note and vitals reviewed.   ED Course  Procedures (including critical care  time)  DIAGNOSTIC STUDIES: Oxygen Saturation is 100% on RA, normal by my interpretation.    COORDINATION OF CARE: 12:06 AM Will order IV fluids, morphine, Zofran, Pepcid, abdomen US, and labs. Discussed treatment plan with pt at bedside and pt agreed to plan.   Labs Review Labs Reviewed  BASIC METABOLIC PANEL - Abnormal; Notable for the following:    Glucose, Bld 108 (*)    All other components within normal limits  CBC - Abnormal; Notable for the following:    RBC 5.29 (*)    MCV 74.3 (*)    MCH 25.3 (*)    RDW 20.0 (*)    All other components within normal limits  HEPATIC FUNCTION PANEL - Abnormal; Notable for the following:    Total Protein 8.2 (*)    All other components within normal limits  LIPASE, BLOOD  URINALYSIS, ROUTINE W REFLEX MICROSCOPIC (NOT AT [facility])  Rosezena Sensor, ED    Imaging Review Dg Chest 2 View  12/04/2015  CLINICAL DATA:  Chest pain, sudden onset EXAM: CHEST  2 VIEW COMPARISON:  11/03/2013 FINDINGS: Normal heart size and mediastinal contours. No acute infiltrate or edema. No effusion or pneumothorax. No osseous findings. IMPRESSION: Negative chest Electronically Signed   By: Marnee Spring M.D.   On: 12/04/2015 23:01   US Abdomen Limited Ruq  12/05/2015  CLINICAL DATA:  Right upper quadrant pain EXAM: US ABDOMEN LIMITED - RIGHT UPPER QUADRANT COMPARISON:  07/30/2014 FINDINGS: Gallbladder: No gallstones or wall thickening visualized. No sonographic Murphy sign noted by sonographer. Common bile duct: Diameter:  4mm.  Where visualized, no filling defect. Liver: Limited visualization of the upper liver due to narrow sonographic windows. No focal lesion identified. Within normal limits in parenchymal echogenicity. IMPRESSION: Negative right upper quadrant ultrasound. Electronically Signed   By: Marnee Spring M.D.   On: 12/05/2015 01:48   I have personally reviewed and evaluated these images and lab results as part of my medical decision-making.   EKG  Interpretation   Date/Time:  Wednesday December 04 2015 22:18:59 EDT Ventricular Rate:  70 PR Interval:  143 QRS Duration: 82 QT Interval:  390 QTC Calculation: 421 R Axis:   76 Text Interpretation:  Sinus rhythm Low voltage, precordial leads Baseline  wander in lead(s) II aVF Confirmed by HORTON  MD, COURTNEY ([id number]) on  12/04/2015 11:51:29 PM      MDM   Final diagnoses:  RUQ pain    Patient presents with acute on chronic right upper quadrant and epigastric pain. Reports that she's had multiple evaluations by her primary physician. She is currently on a PPI. She reports ultrasound and endoscopy. She has follow-up with GI but had worsening pain tonight. She is nontoxic. No signs of peritonitis. Vital signs reassuring. She does have an ultrasound in our system from February which was negative at that time. Lab work is largely reassuring. EKG shows no signs of ischemia and troponin is negative. She describes more burning chest pain which is likely related to some reflux. Repeat ultrasound is also reassuring. Patient reports improvement of symptoms with pain and nausea medication. She is able to tolerate PO. Patient may need HIDA scan for further evaluation of the gallbladder given persistence of symptoms; however, lab workup and workup in the emergency room is reassuring at this time. Encouraged patient to follow-up closely with PCP and GI as scheduled. She was given strict return precautions. Provide her with a short course of pain medication at discharge.  After history, exam, and medical workup I feel the patient has been appropriately medically screened and is safe for discharge home. Pertinent diagnoses were discussed with the patient. Patient was given return precautions.   I personally performed the services described in this documentation, which was scribed in my presence. The recorded information has been reviewed and is accurate.   Shon Baton, MD 12/05/15 [phone]

## 2015-12-04 NOTE — ED Notes (Signed)
Patient transported to X-ray 

## 2015-12-04 NOTE — ED Notes (Addendum)
Patient presents with left sided CP, RUQ abdominal pain and nausea x1-2 days. Denies SOB, vomiting, lightheadedness or dizziness. Also reports increased UO and recently informed by PCP of blood in stool. Patient has an upcoming appointment with a GI specialist.

## 2015-12-04 NOTE — ED Notes (Signed)
Bed: WA20 Expected date:  Expected time:  Means of arrival:  Comments: Transfer from med center

## 2015-12-05 ENCOUNTER — Emergency Department (HOSPITAL_COMMUNITY): Payer: Medicaid Other

## 2015-12-05 LAB — HEPATIC FUNCTION PANEL
ALT: 18 U/L (ref 14–54)
AST: 19 U/L (ref 15–41)
Albumin: 4.4 g/dL (ref 3.5–5.0)
Alkaline Phosphatase: 72 U/L (ref 38–126)
BILIRUBIN TOTAL: 0.6 mg/dL (ref 0.3–1.2)
Bilirubin, Direct: 0.1 mg/dL (ref 0.1–0.5)
Indirect Bilirubin: 0.5 mg/dL (ref 0.3–0.9)
Total Protein: 8.2 g/dL — ABNORMAL HIGH (ref 6.5–8.1)

## 2015-12-05 LAB — LIPASE, BLOOD: Lipase: 19 U/L (ref 11–51)

## 2015-12-05 LAB — URINALYSIS, ROUTINE W REFLEX MICROSCOPIC
Bilirubin Urine: NEGATIVE
Glucose, UA: NEGATIVE mg/dL
HGB URINE DIPSTICK: NEGATIVE
Ketones, ur: NEGATIVE mg/dL
Leukocytes, UA: NEGATIVE
Nitrite: NEGATIVE
PH: 5.5 (ref 5.0–8.0)
Protein, ur: NEGATIVE mg/dL
SPECIFIC GRAVITY, URINE: 1.007 (ref 1.005–1.030)

## 2015-12-05 MED ORDER — MORPHINE SULFATE (PF) 4 MG/ML IV SOLN
4.0000 mg | Freq: Once | INTRAVENOUS | Status: AC
  Administered 2015-12-05: 4 mg via INTRAVENOUS
  Filled 2015-12-05: qty 1

## 2015-12-05 MED ORDER — ONDANSETRON HCL 4 MG/2ML IJ SOLN
4.0000 mg | Freq: Once | INTRAMUSCULAR | Status: AC
  Administered 2015-12-05: 4 mg via INTRAVENOUS
  Filled 2015-12-05: qty 2

## 2015-12-05 MED ORDER — FAMOTIDINE IN NACL 20-0.9 MG/50ML-% IV SOLN
20.0000 mg | Freq: Once | INTRAVENOUS | Status: AC
  Administered 2015-12-05: 20 mg via INTRAVENOUS
  Filled 2015-12-05: qty 50

## 2015-12-05 MED ORDER — SODIUM CHLORIDE 0.9 % IV BOLUS (SEPSIS)
1000.0000 mL | Freq: Once | INTRAVENOUS | Status: AC
  Administered 2015-12-05: 1000 mL via INTRAVENOUS

## 2015-12-05 MED ORDER — HYDROCODONE-ACETAMINOPHEN 5-325 MG PO TABS: 1.0000 | ORAL_TABLET | Freq: Four times a day (QID) | ORAL | Status: AC | PRN

## 2015-12-05 NOTE — ED Notes (Signed)
US at bedside

## 2015-12-05 NOTE — ED Notes (Addendum)
Pt.made aware for the need of urine. 

## 2015-12-05 NOTE — Discharge Instructions (Signed)
You were seen today for abdominal and chest pain. Your symptoms suggests gastrointestinal calls. Your workup is reassuring. You need follow-up with GI as scheduled. Continue your proton pump inhibitor. If you have new or worsening symptoms she should be reevaluated immediately.  Abdominal Pain, Adult Many things can cause abdominal pain. Usually, abdominal pain is not caused by a disease and will improve without treatment. It can often be observed and treated at home. Your health care provider will do a physical exam and possibly order blood tests and X-rays to help determine the seriousness of your pain. However, in many cases, more time must pass before a clear cause of the pain can be found. Before that point, your health care provider may not know if you need more testing or further treatment. HOME CARE INSTRUCTIONS Monitor your abdominal pain for any changes. The following actions may help to alleviate any discomfort you are experiencing:  Only take over-the-counter or prescription medicines as directed by your health care provider.  Do not take laxatives unless directed to do so by your health care provider.  Try a clear liquid diet (broth, tea, or water) as directed by your health care provider. Slowly move to a bland diet as tolerated. SEEK MEDICAL CARE IF:  You have unexplained abdominal pain.  You have abdominal pain associated with nausea or diarrhea.  You have pain when you urinate or have a bowel movement.  You experience abdominal pain that wakes you in the night.  You have abdominal pain that is worsened or improved by eating food.  You have abdominal pain that is worsened with eating fatty foods.  You have a fever. SEEK IMMEDIATE MEDICAL CARE IF:  Your pain does not go away within 2 hours.  You keep throwing up (vomiting).  Your pain is felt only in portions of the abdomen, such as the right side or the left lower portion of the abdomen.  You pass bloody or black  tarry stools. MAKE SURE YOU:  Understand these instructions.  Will watch your condition.  Will get help right away if you are not doing well or get worse.   This information is not intended to replace advice given to you by your health care provider. Make sure you discuss any questions you have with your health care provider.   Document Released: 03/25/2005 Document Revised: 03/06/2015 Document Reviewed: 02/22/2013 Elsevier Interactive Patient Education [employer]2016 Elsevier Inc.

## 2016-02-19 ENCOUNTER — Other Ambulatory Visit: Payer: Self-pay | Admitting: Vascular Surgery

## 2016-04-16 ENCOUNTER — Encounter: Payer: Self-pay | Admitting: Vascular Surgery

## 2016-04-20 ENCOUNTER — Encounter: Payer: Self-pay | Admitting: Vascular Surgery

## 2016-04-20 ENCOUNTER — Ambulatory Visit (INDEPENDENT_AMBULATORY_CARE_PROVIDER_SITE_OTHER): Payer: Medicaid Other | Admitting: Vascular Surgery

## 2016-04-20 VITALS — BP 137/76 | HR 93 | Temp 99.5°F | Resp 16 | Ht 65.0 in | Wt 169.0 lb

## 2016-04-20 DIAGNOSIS — I781 Nevus, non-neoplastic: Secondary | ICD-10-CM | POA: Diagnosis not present

## 2016-04-20 NOTE — Progress Notes (Signed)
Subjective:     Patient ID: Krystal EdwardsSiham Duran, female   DOB: Jan 14, 1971, 45 y.o.   MRN: [id number]  HPI This 45 year old female was referred for evaluation of possible varicose vein right leg by Dr. Harrietta GuardianSarah Bailey. Patient has no history of DVT thrombophlebitis stasis ulcers or bleeding. She has noticed a bluish discomfort in her posterior thigh which she thinks has enlarged. There is aching discomfort in this area particularly during her menstrual cycle. She does not were elastic compression stockings. She does not complain of distal edema. She does not take anticoagulants.  Past Medical History:  Diagnosis Date  . Anemia 07/10/2013  . Anemia 07/10/2013  . Arthritis 2005 and 2009   Pregnancy induced   . Breast pain in female 12/19/2010  . Costochondritis 09/11/2011  . Fatigue 12/19/2010  . GERD (gastroesophageal reflux disease) 08/29/2014  . Gestational diabetes 2009   Had to take oral hypoglycemic   . HLD (hyperlipidemia) 07/04/2014  . Hypertension 2009    Gestational HTN, had to take medication in 2009  . Peripheral vascular disease (HCC)    varicose veins  . Tobacco abuse     Social History  Substance Use Topics  . Smoking status: Light Tobacco Smoker    Packs/day: 0.10    Years: 2.00    Types: Cigarettes, Cigars  . Smokeless tobacco: Never Used  . Alcohol use No    Family History  Problem Relation Age of Onset  . Hypertension Mother   . Hyperlipidemia Mother   . Arthritis Mother     knees  . Hyperlipidemia Father   . Stroke Father [id number]  . Hyperlipidemia Brother   . Hypertension Maternal Aunt   . Arthritis Sister     knees  . Migraines Sister   . Migraines Sister   . Cancer Cousin 38    Vaginal cancer    Allergies  Allergen Reactions  . Metformin And Related Other (See Comments)    GI upset   . Motrin [Ibuprofen] Swelling    Facial swelling     Current Outpatient Prescriptions:  .  aspirin-acetaminophen-caffeine (EXCEDRIN MIGRAINE) 250-250-65 MG tablet, Take 2  tablets by mouth every 6 (six) hours as needed for headache., Disp: , Rfl:  .  atorvastatin (LIPITOR) 40 MG tablet, Take 1 tablet (40 mg total) by mouth daily., Disp: 90 tablet, Rfl: 3 .  Elastic Bandages & Supports (TENNIS ELBOW STRAP) MISC, 1 Device by Does not apply route daily., Disp: 1 each, Rfl: 0 .  ferrous sulfate 325 (65 FE) MG tablet, Take 325 mg by mouth 3 (three) times daily., Disp: , Rfl:  .  glipiZIDE (GLUCOTROL) 5 MG tablet, Take 5 mg by mouth daily., Disp: , Rfl:  .  metroNIDAZOLE (METROGEL) 1 % gel, Apply topically daily. Apply to forehead and cheeks (Patient taking differently: Apply 1 application topically 2 (two) times daily. Apply to forehead and cheeks), Disp: 45 g, Rfl: 0 .  Omega-3 Fatty Acids (FISH OIL) 600 MG CAPS, Take 1 capsule by mouth 4 (four) times daily. , Disp: , Rfl:  .  pantoprazole (PROTONIX) 40 MG tablet, Take 40 mg by mouth daily., Disp: , Rfl:  .  aspirin EC 81 MG tablet, Take 1 tablet (81 mg total) by mouth daily. (Patient not taking: Reported on 04/20/2016), Disp: , Rfl:  .  glipiZIDE (GLUCOTROL) 10 MG tablet, TAKE ONE TABLET BY MOUTH TWICE DAILY BEFORE MEAL(S) (Patient not taking: Reported on 04/20/2016), Disp: 180 tablet, Rfl: 0 .  HYDROcodone-acetaminophen (NORCO/VICODIN) 5-325 MG  tablet, Take 1 tablet by mouth every 6 (six) hours as needed. (Patient not taking: Reported on 04/20/2016), Disp: 6 tablet, Rfl: 0 .  omeprazole (PRILOSEC) 20 MG capsule, Take 1 capsule (20 mg total) by mouth daily. (Patient not taking: Reported on 04/20/2016), Disp: 30 capsule, Rfl: 3  Vitals:   04/20/16 1408  BP: 137/76  Pulse: 93  Resp: 16  Temp: 99.5 F (37.5 C)  SpO2: 100%  Weight: 169 lb (76.7 kg)  Height: 5\' 5"  (1.651 m)    Body mass index is 28.12 kg/m.         Review of Systems Chest pain, dyspnea on exertion, PND, orthopnea. Has gestational diabetes treated with medication-oral.    Objective:   Physical Exam BP 137/76 (BP Location: Left Arm,  Patient Position: Sitting, Cuff Size: Normal)   Pulse 93   Temp 99.5 F (37.5 C)   Resp 16   Ht 5\' 5"  (1.651 m)   Wt 169 lb (76.7 kg)   SpO2 100%   BMI 28.12 kg/m     Gen.-alert and oriented x3 in no apparent distress HEENT normal for age Lungs no rhonchi or wheezing Cardiovascular regular rhythm no murmurs carotid pulses 3+ palpable no bruits audible Abdomen soft nontender no palpable masses Musculoskeletal free of  major deformities Skin clear -no rashes Neurologic normal Lower extremities 3+ femoral and dorsalis pedis pulses palpable bilaterally with no edema Right posterior thigh has small patch of cutaneous telangiectasia-spider veins just distal to the crease. No bulging varicosities noted in either lower extremity-no hyperpigmentation or ulceration noted.  Today I performed a bedside ultrasound-sono site exam and the right great saphenous vein is totally normal in size with no evidence of reflux    Assessment:     Isolated spider vein right proximal posterior thigh with no evidence reflux right great saphenous vein on bedside ultrasound exam    Plan:     Have offered patient foam sclerotherapy as treatment option versus no treatment and observation She would like to proceed with sclerotherapy and we will schedule this in the near future She does understand that this is not 100% effective in relieving her symptoms but hopefully will improve or relieve them.

## 2016-06-18 ENCOUNTER — Encounter: Payer: Self-pay | Admitting: *Deleted

## 2016-07-01 ENCOUNTER — Ambulatory Visit (INDEPENDENT_AMBULATORY_CARE_PROVIDER_SITE_OTHER): Payer: Self-pay | Admitting: *Deleted

## 2016-07-01 DIAGNOSIS — I8393 Asymptomatic varicose veins of bilateral lower extremities: Secondary | ICD-10-CM

## 2016-07-01 DIAGNOSIS — I83811 Varicose veins of right lower extremities with pain: Secondary | ICD-10-CM

## 2016-07-01 NOTE — Progress Notes (Signed)
X=.3% Sotradecol administered with a 27g butterfly.  Patient received a total of 4cc.  Main concern was 2 large reticulars at the top of her back right thigh. Easy access. Hope this gives her the pain relief she is seeking. Follow prn.    Compression stockings applied: Yes.  and ace wrap to compress the top some more

## 2016-09-02 ENCOUNTER — Ambulatory Visit: Payer: Medicaid Other | Admitting: Internal Medicine

## 2016-09-02 NOTE — Progress Notes (Deleted)
   [facility] Phone: [phone]   Date of Visit: 09/02/2016   HPI:  DM2:  - A1c 6.7 (05/2016) < 6.1 (01/2016) < 5.9 (05/2015)  ROS: See HPI.  PMFSH:  PMH: Menstrual Migraine Varicose Vein NASH GERD DM2 Rosacea Hx of Gestational HTN Tobacco Use Hypertriglyceridemia  PHYSICAL EXAM: There were no vitals taken for this visit. Gen: *** HEENT: *** Heart: *** Lungs: *** Neuro: *** Ext: ***  ASSESSMENT/PLAN:  Health maintenance:  -***  No problem-specific Assessment & Plan notes found for this encounter.  FOLLOW UP: Follow up in *** for ***  Palma HolterKanishka G Nichael Ehly, MD PGY 2 [facility]

## 2017-02-09 ENCOUNTER — Other Ambulatory Visit: Payer: Self-pay | Admitting: Nurse Practitioner

## 2017-02-09 DIAGNOSIS — Z1231 Encounter for screening mammogram for malignant neoplasm of breast: Secondary | ICD-10-CM

## 2017-02-12 ENCOUNTER — Inpatient Hospital Stay: Admission: RE | Admit: 2017-02-12 | Payer: Medicaid Other | Source: Ambulatory Visit

## 2017-02-12 ENCOUNTER — Ambulatory Visit
Admission: RE | Admit: 2017-02-12 | Discharge: 2017-02-12 | Disposition: A | Discharge status: Home / Self Care | Payer: Medicaid Other | Source: Ambulatory Visit | Attending: Nurse Practitioner | Admitting: Nurse Practitioner

## 2017-02-12 DIAGNOSIS — Z1231 Encounter for screening mammogram for malignant neoplasm of breast: Secondary | ICD-10-CM

## 2017-03-02 ENCOUNTER — Emergency Department (HOSPITAL_COMMUNITY)
Admission: EM | Admit: 2017-03-02 | Discharge: 2017-03-02 | Disposition: A | Discharge status: Home / Self Care | Payer: Medicaid Other | Attending: Emergency Medicine | Admitting: Emergency Medicine

## 2017-03-02 ENCOUNTER — Encounter (HOSPITAL_COMMUNITY): Payer: Self-pay

## 2017-03-02 DIAGNOSIS — E119 Type 2 diabetes mellitus without complications: Secondary | ICD-10-CM | POA: Diagnosis not present

## 2017-03-02 DIAGNOSIS — Z79899 Other long term (current) drug therapy: Secondary | ICD-10-CM | POA: Diagnosis not present

## 2017-03-02 DIAGNOSIS — Z7984 Long term (current) use of oral hypoglycemic drugs: Secondary | ICD-10-CM | POA: Diagnosis not present

## 2017-03-02 DIAGNOSIS — F17210 Nicotine dependence, cigarettes, uncomplicated: Secondary | ICD-10-CM | POA: Diagnosis not present

## 2017-03-02 DIAGNOSIS — N938 Other specified abnormal uterine and vaginal bleeding: Secondary | ICD-10-CM

## 2017-03-02 LAB — BASIC METABOLIC PANEL
ANION GAP: 8 (ref 5–15)
BUN: 20 mg/dL (ref 6–20)
CALCIUM: 9.3 mg/dL (ref 8.9–10.3)
CO2: 26 mmol/L (ref 22–32)
CREATININE: 0.64 mg/dL (ref 0.44–1.00)
Chloride: 104 mmol/L (ref 101–111)
Glucose, Bld: 113 mg/dL — ABNORMAL HIGH (ref 65–99)
Potassium: 3.9 mmol/L (ref 3.5–5.1)
SODIUM: 138 mmol/L (ref 135–145)

## 2017-03-02 LAB — CBC
HEMATOCRIT: 37.6 % (ref 36.0–46.0)
Hemoglobin: 12.5 g/dL (ref 12.0–15.0)
MCH: 26.2 pg (ref 26.0–34.0)
MCHC: 33.2 g/dL (ref 30.0–36.0)
MCV: 78.7 fL (ref 78.0–100.0)
PLATELETS: 346 10*3/uL (ref 150–400)
RBC: 4.78 MIL/uL (ref 3.87–5.11)
RDW: 13.8 % (ref 11.5–15.5)
WBC: 10.6 10*3/uL — AB (ref 4.0–10.5)

## 2017-03-02 LAB — POC URINE PREG, ED: PREG TEST UR: NEGATIVE

## 2017-03-02 NOTE — ED Triage Notes (Signed)
Patient c/o vaginal bleeding x 15 days. Patient also c/o intermittent abdominal cramping. Patient states she also has a left ovarian cyst.

## 2017-03-02 NOTE — ED Provider Notes (Signed)
[facility]-EMERGENCY DEPT Provider Note   CSN: [id number] Arrival date & time: 03/02/17  1618     History   Chief Complaint Chief Complaint  Patient presents with  . Vaginal Bleeding    HPI Krystal Duran is a 46 y.o. female.  The history is provided by the patient.  Vaginal Bleeding  Primary symptoms include vaginal bleeding.  Primary symptoms include no discharge, no pelvic pain, no dysuria. There has been no fever. This is a new problem. Episode onset: 15 days ago. The problem occurs constantly. The problem has been gradually improving. The symptoms occur spontaneously. She is not pregnant. She has not missed her period. LMP: last month and then now. The patient's menstrual history has been regular. The discharge was normal. Pertinent negatives include no abdominal pain, no nausea, no vomiting, no light-headedness and no dizziness. She has tried nothing for the symptoms. She uses nothing for contraception. Associated medical issues include ovarian cysts.    Past Medical History:  Diagnosis Date  . Anemia 07/10/2013  . Anemia 07/10/2013  . Arthritis 2005 and 2009   Pregnancy induced   . Breast pain in female 12/19/2010  . Costochondritis 09/11/2011  . Fatigue 12/19/2010  . GERD (gastroesophageal reflux disease) 08/29/2014  . Gestational diabetes 2009   Had to take oral hypoglycemic   . HLD (hyperlipidemia) 07/04/2014  . Hypertension 2009    Gestational HTN, had to take medication in 2009  . Peripheral vascular disease (HCC)    varicose veins  . Tobacco abuse     Patient Active Problem List   Diagnosis Date Noted  . Spider veins 04/20/2016  . GERD (gastroesophageal reflux disease) 08/29/2014  . Rib pain 07/25/2014  . Hypertriglyceridemia 07/04/2014  . Right tennis elbow 07/04/2014  . Viral URI with cough 12/08/2013  . Varicose vein 12/08/2013  . Rosacea 10/31/2013  . Tension headache 07/10/2013  . Tobacco abuse 03/20/2013  . NASH (nonalcoholic steatohepatitis) 12/04/2011  . DM  (diabetes mellitus), type 2, uncontrolled (HCC) 10/30/2011  . H/O: HTN (hypertension) 11/18/2010  . Menstrual migraine 11/18/2010    Past Surgical History:  Procedure Laterality Date  . LASIK  2009  . TUBAL LIGATION  2000    OB History    Gravida Para Term Preterm AB Living   5 5 5  0 0 5   SAB TAB Ectopic Multiple Live Births   0 0 0 0        Obstetric Comments   Gestational HTN-2009 and 2010 Gestational diabetes- 2010       Home Medications    Prior to Admission medications   Medication Sig Start Date End Date Taking? Authorizing Provider  aspirin-acetaminophen-caffeine (EXCEDRIN MIGRAINE) [phone] MG tablet Take 2 tablets by mouth every 6 (six) hours as needed for headache.   Yes [provider]  ferrous sulfate 325 (65 FE) MG tablet Take 325 mg by mouth daily with breakfast.  10/18/15  Yes [provider]  glipiZIDE (GLUCOTROL) 5 MG tablet Take 5 mg by mouth daily. 07/15/15  Yes [provider]  metroNIDAZOLE (METROGEL) 1 % gel Apply topically daily. Apply to forehead and cheeks Patient taking differently: Apply 1 application topically 2 (two) times daily. Apply to forehead and cheeks 10/31/13  Yes Funches, Josalyn, MD  Omega-3 Fatty Acids (FISH OIL) 600 MG CAPS Take 1 capsule by mouth 4 (four) times daily.    Yes [provider]  aspirin EC 81 MG tablet Take 1 tablet (81 mg total) by mouth daily. Patient not  taking: Reported on 04/20/2016 02/09/13   Dessa Phi, MD  atorvastatin (LIPITOR) 40 MG tablet Take 1 tablet (40 mg total) by mouth daily. Patient not taking: Reported on 03/02/2017 07/03/14   Glori Luis, MD  Elastic Bandages & Supports (TENNIS ELBOW STRAP) MISC 1 Device by Does not apply route daily. 07/03/14   Glori Luis, MD  glipiZIDE (GLUCOTROL) 10 MG tablet TAKE ONE TABLET BY MOUTH TWICE DAILY BEFORE MEAL(S) Patient not taking: Reported on 04/20/2016 01/31/15   Palma Holter, MD  HYDROcodone-acetaminophen  (NORCO/VICODIN) 5-325 MG tablet Take 1 tablet by mouth every 6 (six) hours as needed. Patient not taking: Reported on 04/20/2016 12/05/15   Horton, Mayer Masker, MD  omeprazole (PRILOSEC) 20 MG capsule Take 1 capsule (20 mg total) by mouth daily. Patient not taking: Reported on 04/20/2016 08/28/14   Glori Luis, MD    Family History Family History  Problem Relation Age of Onset  . Hypertension Mother   . Hyperlipidemia Mother   . Arthritis Mother        knees  . Hyperlipidemia Father   . Stroke Father 85  . Hyperlipidemia Brother   . Hypertension Maternal Aunt   . Breast cancer Sister   . Arthritis Sister        knees  . Migraines Sister   . Migraines Sister   . Cancer Cousin 38       Vaginal cancer  . Breast cancer Cousin     Social History Social History  Substance Use Topics  . Smoking status: Light Tobacco Smoker    Packs/day: 0.10    Years: 2.00    Types: Cigarettes, Cigars  . Smokeless tobacco: Never Used  . Alcohol use No     Allergies   Metformin and related and Motrin [ibuprofen]   Review of Systems Review of Systems  Gastrointestinal: Negative for abdominal pain, nausea and vomiting.  Genitourinary: Positive for vaginal bleeding. Negative for dysuria and pelvic pain.  Neurological: Negative for dizziness and light-headedness.  All other systems reviewed and are negative.    Physical Exam Updated Vital Signs BP 132/82 (BP Location: Left Arm)   Pulse 76   Temp 98.5 F (36.9 C) (Oral)   Resp 18   Ht 5\' 5"  (1.651 m)   Wt 76.2 kg (168 lb)   LMP 02/11/2017   SpO2 100%   BMI 27.96 kg/m   Physical Exam  Constitutional: She is oriented to person, place, and time. She appears well-developed and well-nourished. No distress.  HENT:  Head: Normocephalic and atraumatic.  Mouth/Throat: Oropharynx is clear and moist.  Eyes: Pupils are equal, round, and reactive to light. Conjunctivae and EOM are normal.  Neck: Normal range of motion. Neck supple.    Cardiovascular: Normal rate, regular rhythm and intact distal pulses.   No murmur heard. Pulmonary/Chest: Effort normal and breath sounds normal. No respiratory distress. She has no wheezes. She has no rales.  Abdominal: Soft. She exhibits no distension. There is no tenderness. There is no rebound and no guarding.  Genitourinary: Uterus normal. Cervix exhibits no motion tenderness, no discharge and no friability. Right adnexum displays no mass, no tenderness and no fullness. Left adnexum displays no mass, no tenderness and no fullness. There is bleeding in the vagina.  Musculoskeletal: Normal range of motion. She exhibits no edema or tenderness.  Neurological: She is alert and oriented to person, place, and time.  Skin: Skin is warm and dry. No rash noted. No erythema.  Psychiatric:  She has a normal mood and affect. Her behavior is normal.  Nursing note and vitals reviewed.    ED Treatments / Results  Labs (all labs ordered are listed, but only abnormal results are displayed) Labs Reviewed  CBC - Abnormal; Notable for the following:       Result Value   WBC 10.6 (*)    All other components within normal limits  BASIC METABOLIC PANEL - Abnormal; Notable for the following:    Glucose, Bld 113 (*)    All other components within normal limits  POC URINE PREG, ED    EKG  EKG Interpretation None       Radiology No results found.  Procedures Procedures (including critical care time)  Medications Ordered in ED Medications - No data to display   Initial Impression / Assessment and Plan / ED Course  I have reviewed the triage vital signs and the nursing notes.  Pertinent labs & imaging results that were available during my care of the patient were reviewed by me and considered in my medical decision making (see chart for details).    Patient presenting for dysfunctional uterine bleeding. She has now been bleeding for the last 14 days. The bleeding is starting to taper off  and she now only needs to change her pad 1-2 times a day. Patient is just wondering why her. Will not stop. She attempted to see her OB/GYN but they state there is no one who works in the office in right now they're waiting for replacement. So they told her to go to the emergency room. Patient is otherwise well-appearing and has normal vital signs. Hemoglobin is within normal limits. Dark blood in the vaginal vault but no concerning findings. Patient is[age] years old and this may be perimenopausal symptoms. She is not pregnant and has no symptoms concerning for STD. Discussed with patient follow-up with an OB/GYN and gave her numbers for women's clinic. At this time no further intervention is required  Final Clinical Impressions(s) / ED Diagnoses   Final diagnoses:  Dysfunctional uterine bleeding    New Prescriptions New Prescriptions   No medications on file     Gwyneth SproutPlunkett, Quinterrius Errington, MD 03/02/17 2253

## 2018-01-11 ENCOUNTER — Other Ambulatory Visit: Payer: Self-pay | Admitting: Nurse Practitioner

## 2018-01-11 DIAGNOSIS — Z1231 Encounter for screening mammogram for malignant neoplasm of breast: Secondary | ICD-10-CM

## 2018-02-18 ENCOUNTER — Ambulatory Visit
Admission: RE | Admit: 2018-02-18 | Discharge: 2018-02-18 | Disposition: A | Discharge status: Home / Self Care | Payer: Medicaid Other | Source: Ambulatory Visit | Attending: Nurse Practitioner | Admitting: Nurse Practitioner

## 2018-02-18 DIAGNOSIS — Z1231 Encounter for screening mammogram for malignant neoplasm of breast: Secondary | ICD-10-CM

## 2018-02-22 ENCOUNTER — Other Ambulatory Visit: Payer: Self-pay | Admitting: Nurse Practitioner

## 2018-02-22 ENCOUNTER — Other Ambulatory Visit: Payer: Self-pay | Admitting: Physician Assistant

## 2018-02-22 DIAGNOSIS — R928 Other abnormal and inconclusive findings on diagnostic imaging of breast: Secondary | ICD-10-CM

## 2018-02-25 ENCOUNTER — Ambulatory Visit
Admission: RE | Admit: 2018-02-25 | Discharge: 2018-02-25 | Disposition: A | Discharge status: Home / Self Care | Payer: Medicaid Other | Source: Ambulatory Visit | Attending: Physician Assistant | Admitting: Physician Assistant

## 2018-02-25 ENCOUNTER — Other Ambulatory Visit: Payer: Self-pay | Admitting: Physician Assistant

## 2018-02-25 ENCOUNTER — Ambulatory Visit
Admission: RE | Admit: 2018-02-25 | Discharge: 2018-02-25 | Disposition: A | Discharge status: Home / Self Care | Payer: Medicaid Other | Source: Ambulatory Visit | Attending: Nurse Practitioner | Admitting: Nurse Practitioner

## 2018-02-25 DIAGNOSIS — N6489 Other specified disorders of breast: Secondary | ICD-10-CM

## 2018-02-25 DIAGNOSIS — R928 Other abnormal and inconclusive findings on diagnostic imaging of breast: Secondary | ICD-10-CM

## 2018-09-12 IMAGING — US ULTRASOUND LEFT BREAST LIMITED
1 series · 3 of 3 positions shown · non-contrast
Comparison: February 18, 2018 and earlier priors.

CLINICAL DATA: 47-year-old patient recalled from recent screening
mammogram for evaluation of a possible asymmetry in the left breast
identified on the CC view. The patient tells me that her sister was
diagnosed with breast cancer at age 32. The patient also has at
least 3 Comme A with a history of breast cancer and 1 cousin with a
history of ovarian cancer.

EXAM:
DIGITAL DIAGNOSTIC LEFT MAMMOGRAM WITH CAD AND TOMO
ULTRASOUND LEFT BREAST

[Series 1: ultrasound left breast limited · 0.08mm/px · 3 of 3 slices shown]
[im 1/3]
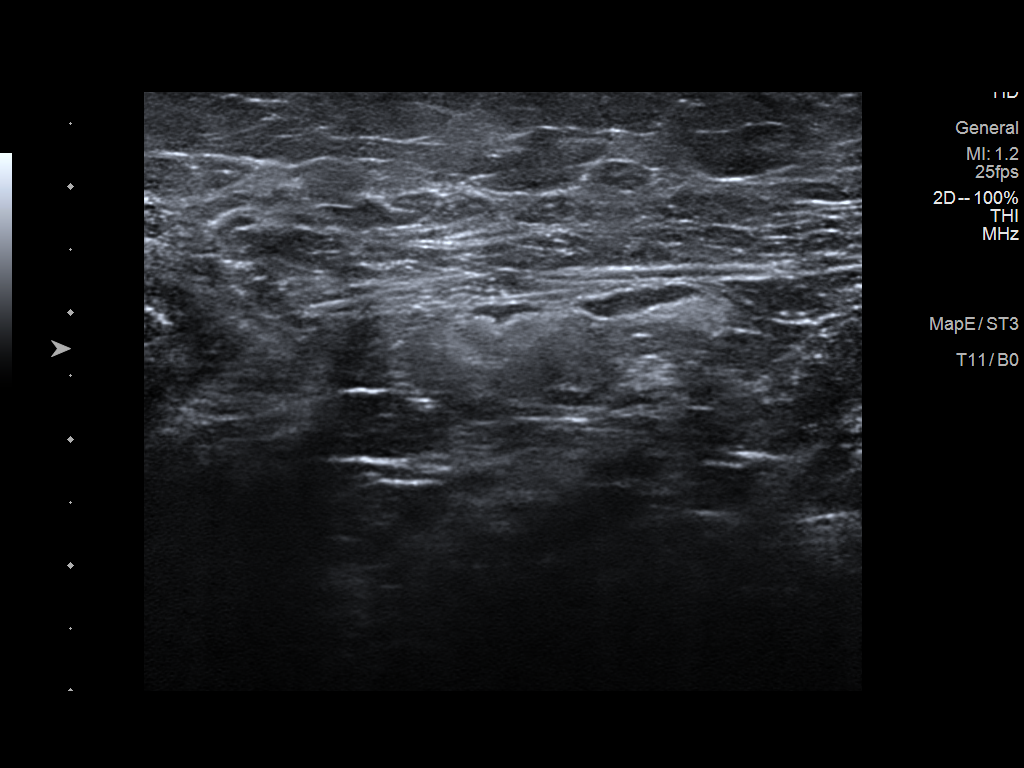
[im 2/3]
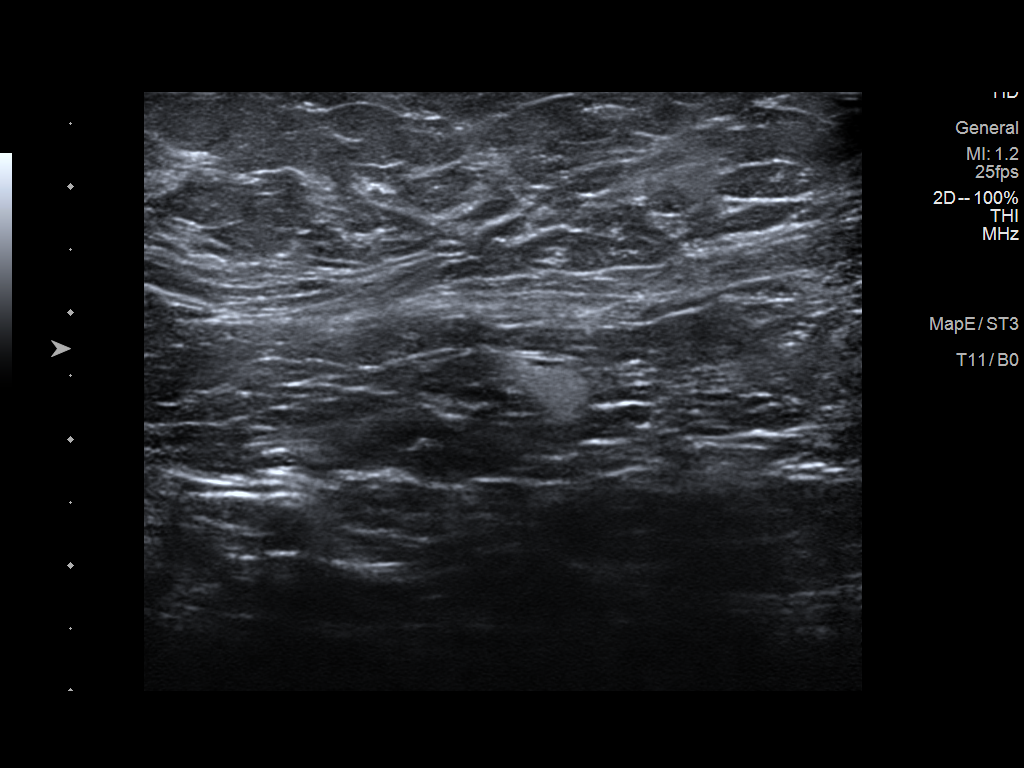
[im 3/3]
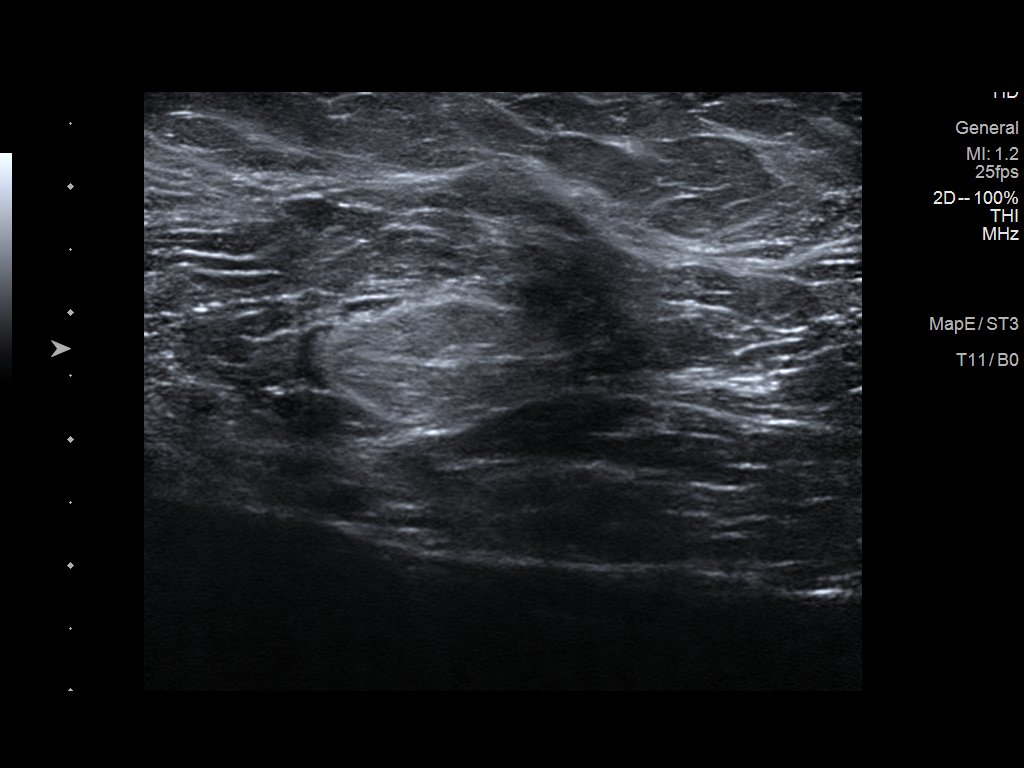

[3 of 3 positions shown; findings below may reference images not displayed]

ACR Breast Density Category b: There are scattered areas of
fibroglandular density.
FINDINGS: Spot compression views of the posterior third of the central
retroareolar left breast show a persistent asymmetry in the
retroglandular fat. This asymmetry is not definitely identified in
the mL projection, but localizes to the central left breast on
tomography images in the CC projection.

Mammographic images were processed with CAD.

Targeted ultrasound is performed, showing normal fibroglandular
tissue in the upper central and lower central left breast. No
sonographic correlate to the asymmetry identified on mammogram is
identified.

Ultrasound the left axilla shows normal lymph nodes. No axillary
lymphadenopathy detected.
IMPRESSION: Developing asymmetry in the posterior third of the central left
breast seen in the CC projection. Malignancy cannot be excluded.

RECOMMENDATION:
Stereotactic biopsy is recommended and will be performed later
today.

I have discussed the findings and recommendations with the patient.
Results were also provided in writing at the conclusion of the
visit. If applicable, a reminder letter will be sent to the patient
regarding the next appointment.

BI-RADS CATEGORY  4: Suspicious.

## 2018-09-12 IMAGING — MG MM CLIP PLACEMENT
4 series · 4 of 12 positions shown · non-contrast
Comparison: Previous exam(s).

CLINICAL DATA: Evaluate biopsy marker

EXAM:
DIAGNOSTIC LEFT MAMMOGRAM POST STEREOTACTIC BIOPSY

[L CC synth-2D]
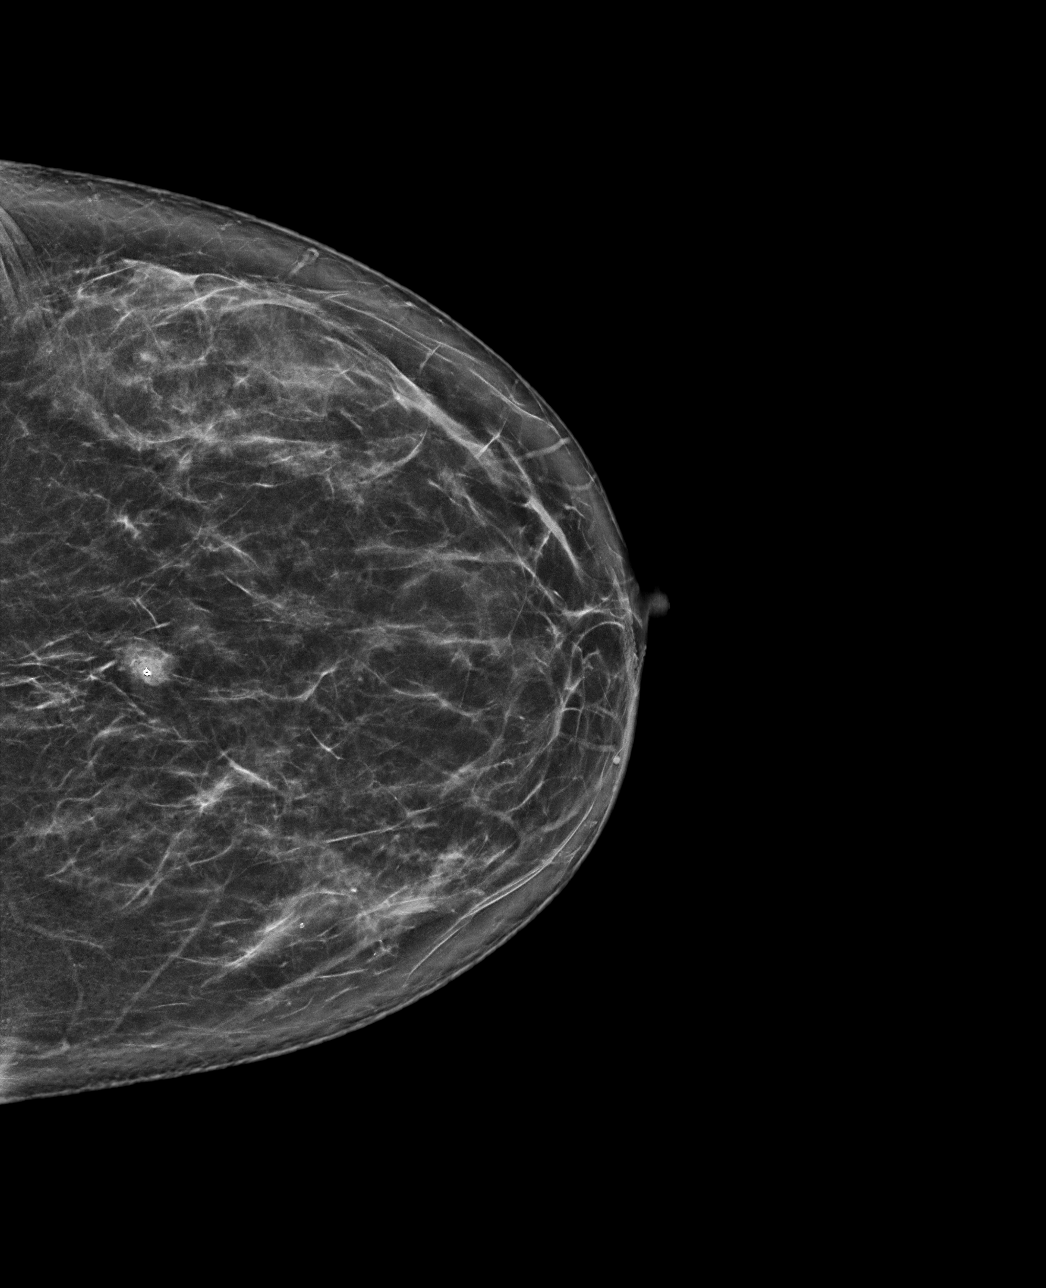

[L ML synth-2D]
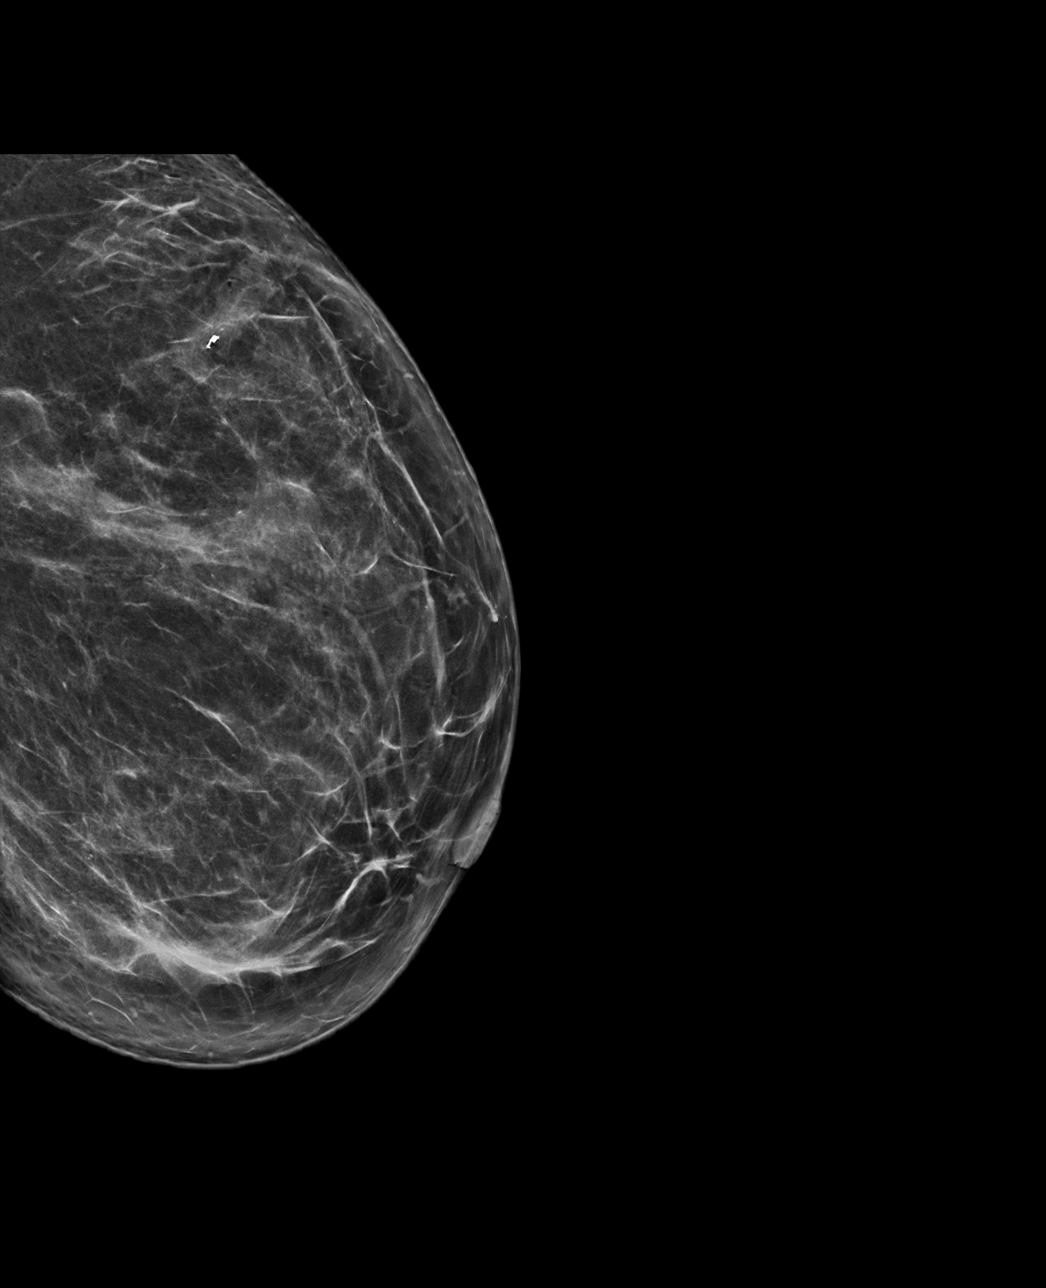

[L ML tomo · tomo slice 39/78.0]
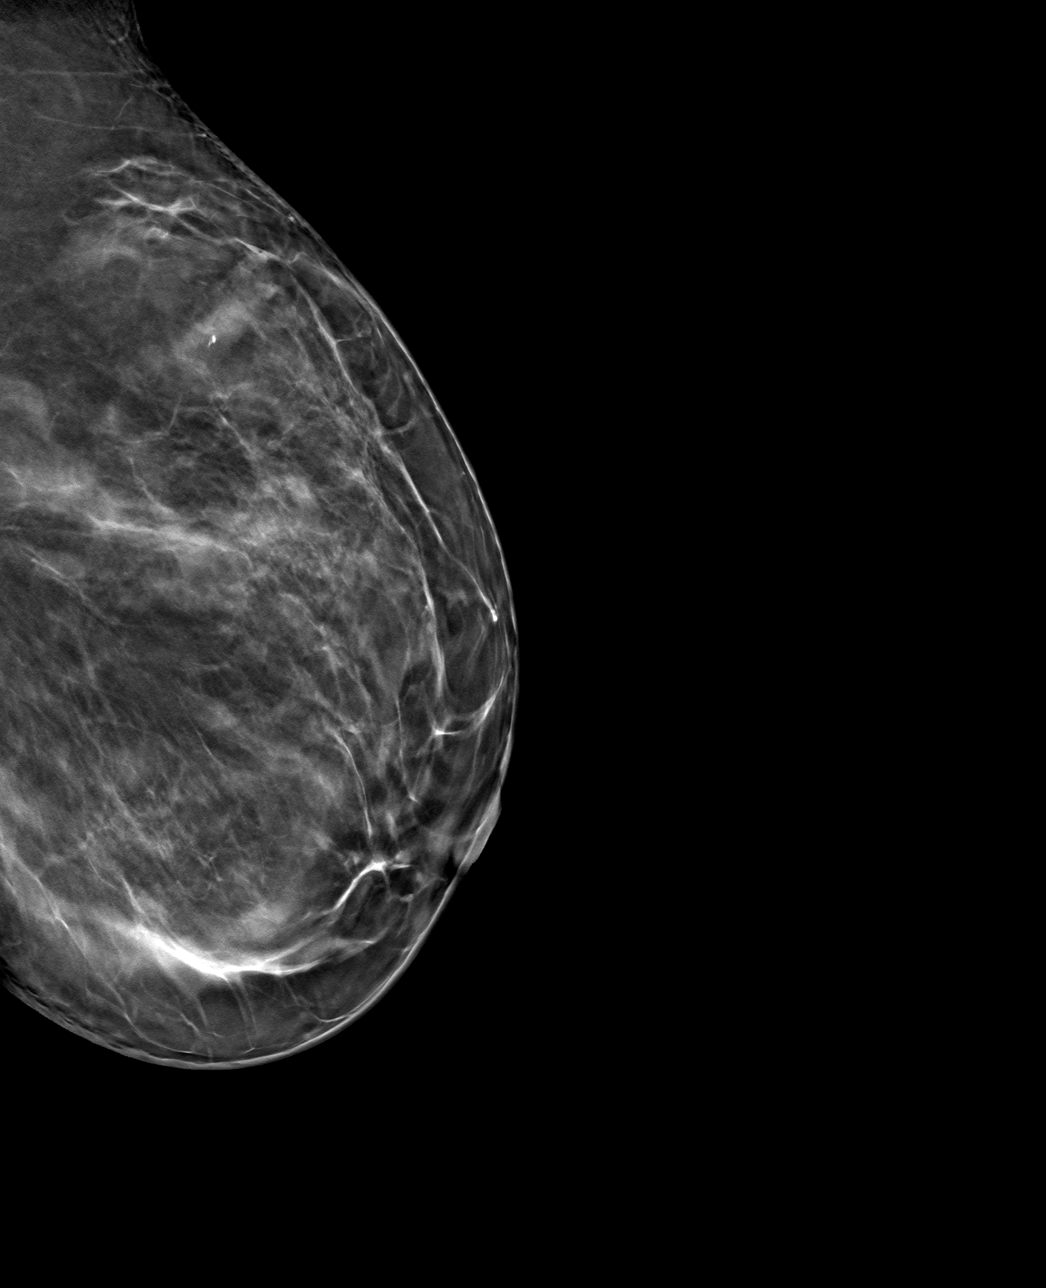

[L CC tomo · tomo slice 39/76.0]
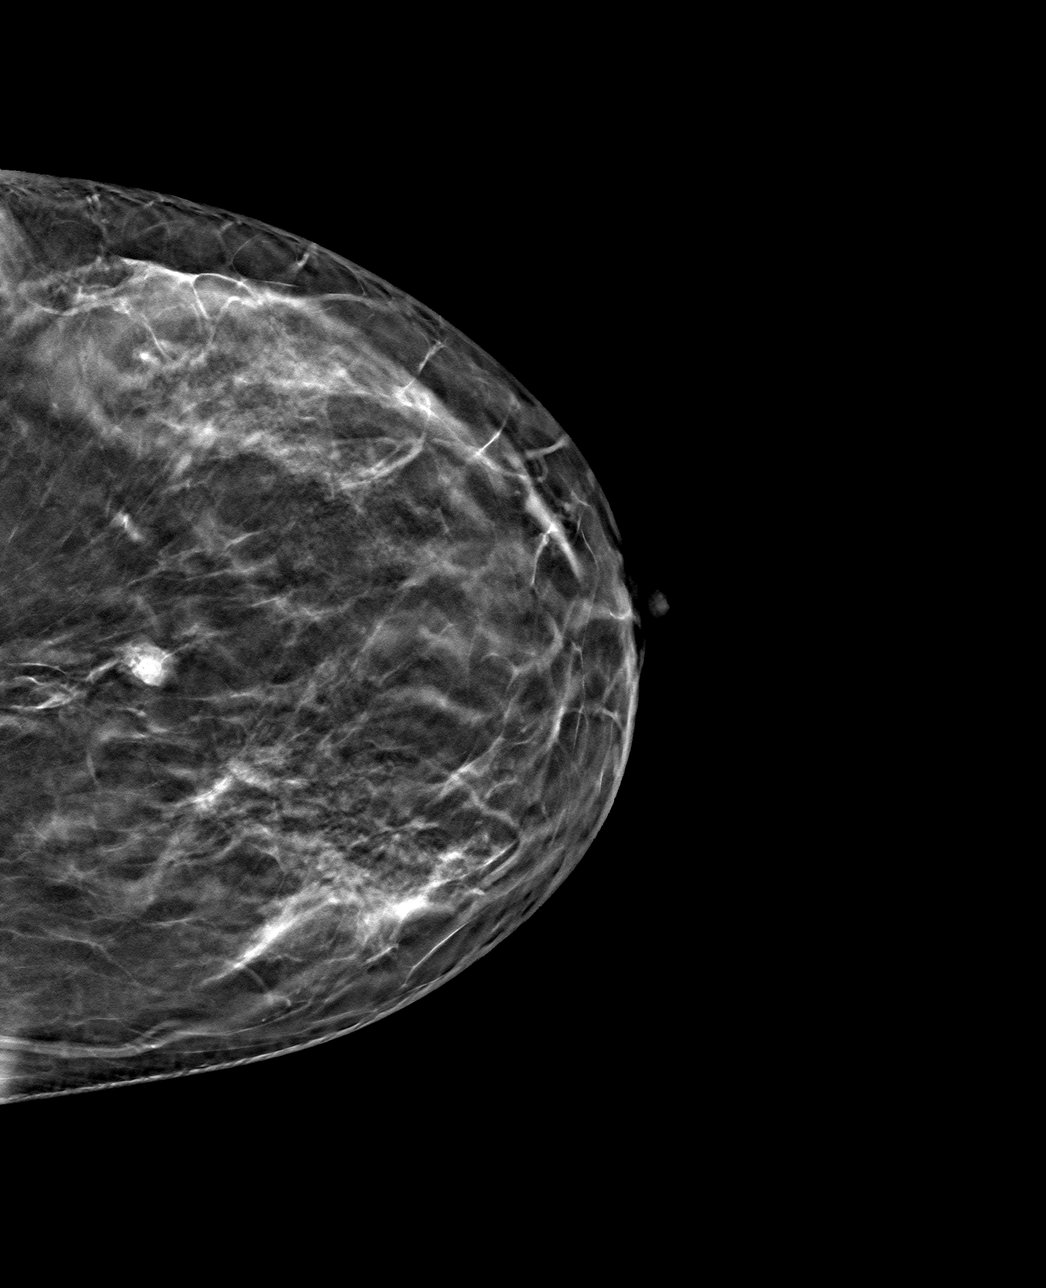

[4 of 12 positions shown; findings below may reference images not displayed]

FINDINGS: Mammographic images were obtained following stereotactic guided
biopsy of a developing density in the left breast. The coil shaped
clip is at the site of the developing density on the CC view. The
developing density is not visible on the 90 degree lateral view.
IMPRESSION: Clip placement as above.

Final Assessment: Post Procedure Mammograms for Marker Placement

## 2019-01-17 ENCOUNTER — Other Ambulatory Visit: Payer: Self-pay | Admitting: Nurse Practitioner

## 2019-01-17 DIAGNOSIS — Z1231 Encounter for screening mammogram for malignant neoplasm of breast: Secondary | ICD-10-CM

## 2019-03-01 ENCOUNTER — Ambulatory Visit
Admission: RE | Admit: 2019-03-01 | Discharge: 2019-03-01 | Disposition: A | Discharge status: Home / Self Care | Payer: Medicaid Other | Source: Ambulatory Visit | Attending: Nurse Practitioner | Admitting: Nurse Practitioner

## 2019-03-01 ENCOUNTER — Other Ambulatory Visit: Payer: Self-pay

## 2019-03-01 DIAGNOSIS — Z1231 Encounter for screening mammogram for malignant neoplasm of breast: Secondary | ICD-10-CM

## 2020-03-01 ENCOUNTER — Other Ambulatory Visit: Payer: Self-pay | Admitting: Nurse Practitioner

## 2020-03-05 ENCOUNTER — Other Ambulatory Visit: Payer: Self-pay | Admitting: Physician Assistant

## 2020-03-05 DIAGNOSIS — Z1231 Encounter for screening mammogram for malignant neoplasm of breast: Secondary | ICD-10-CM

## 2020-03-08 ENCOUNTER — Ambulatory Visit
Admission: RE | Admit: 2020-03-08 | Discharge: 2020-03-08 | Disposition: A | Discharge status: Home / Self Care | Payer: Medicaid Other | Source: Ambulatory Visit | Attending: Physician Assistant | Admitting: Physician Assistant

## 2020-03-08 ENCOUNTER — Other Ambulatory Visit: Payer: Self-pay

## 2020-03-08 DIAGNOSIS — Z1231 Encounter for screening mammogram for malignant neoplasm of breast: Secondary | ICD-10-CM

## 2020-09-24 ENCOUNTER — Ambulatory Visit: Payer: Medicaid Other | Admitting: Podiatry

## 2020-09-24 ENCOUNTER — Other Ambulatory Visit: Payer: Self-pay

## 2020-09-24 DIAGNOSIS — B351 Tinea unguium: Secondary | ICD-10-CM | POA: Diagnosis not present

## 2020-09-25 NOTE — Progress Notes (Signed)
  Subjective:  Patient ID: Krystal Duran, female    DOB: August 17, 1970,  MRN: [id number]  Chief Complaint  Patient presents with  . Nail Problem    Nail fungus and possible ingrown of right hallux    50 y.o. female presents with the above complaint. History confirmed with patient.   Objective:  Physical Exam: warm, good capillary refill, no trophic changes or ulcerative lesions, normal DP and PT pulses and normal sensory exam.  Dystrophic right hallux and left hallux nail with onycholysis, discoloration of nail  Assessment:   1. Onychomycosis      Plan:  Patient was evaluated and treated and all questions answered.  Discussed etiology and treatment options of nail fungus and nail dystrophy.  Recommended biopsy of the nail today with a culture to be taken.  Also discussed oral 1 topical treatment options.  She will follow up next month for review of culture results and plan for treatment thereafter  Return in about 5 weeks (around 10/29/2020) for follow up nail fungus culture results, ingrowing big toenails .

## 2020-10-29 ENCOUNTER — Other Ambulatory Visit: Payer: Self-pay

## 2020-10-29 ENCOUNTER — Ambulatory Visit (INDEPENDENT_AMBULATORY_CARE_PROVIDER_SITE_OTHER): Payer: Medicaid Other | Admitting: Podiatry

## 2020-10-29 DIAGNOSIS — L600 Ingrowing nail: Secondary | ICD-10-CM | POA: Diagnosis not present

## 2020-10-29 DIAGNOSIS — L603 Nail dystrophy: Secondary | ICD-10-CM

## 2020-10-30 ENCOUNTER — Encounter: Payer: Self-pay | Admitting: Podiatry

## 2020-10-30 NOTE — Progress Notes (Signed)
  Subjective:  Patient ID: Krystal Duran, female    DOB: [birth year],  MRN: [id number]  Chief Complaint  Patient presents with  . Nail Problem      for follow up nail fungus culture results, ingrowing big toenails     50 y.o. female returns with the above complaint. History confirmed with patient.   Objective:  Physical Exam: warm, good capillary refill, no trophic changes or ulcerative lesions, normal DP and PT pulses and normal sensory exam.  Dystrophic right hallux and left hallux nail with onycholysis, discoloration of nail   Pathology results from [facility] pathology she has onychodystrophy and keratinization from microtrauma, no evidence of onychomycosis Assessment:   1. Onychodystrophy      Plan:  Patient was evaluated and treated and all questions answered.  Reviewed her pathology results and discussed with her that there is likely not a good fix for this type of onychodystrophy.  We discussed keeping the nails thinned and cut back as far as possible as well as possible nail avulsion permanently if they become bothersome.  Return as needed  Return if symptoms worsen or fail to improve.

## 2021-02-12 ENCOUNTER — Other Ambulatory Visit: Payer: Self-pay | Admitting: Physician Assistant

## 2021-02-12 DIAGNOSIS — Z1231 Encounter for screening mammogram for malignant neoplasm of breast: Secondary | ICD-10-CM

## 2021-03-10 ENCOUNTER — Other Ambulatory Visit: Payer: Self-pay

## 2021-03-10 ENCOUNTER — Ambulatory Visit
Admission: RE | Admit: 2021-03-10 | Discharge: 2021-03-10 | Disposition: A | Discharge status: Home / Self Care | Payer: Medicaid Other | Source: Ambulatory Visit | Attending: Physician Assistant | Admitting: Physician Assistant

## 2021-03-10 DIAGNOSIS — Z1231 Encounter for screening mammogram for malignant neoplasm of breast: Secondary | ICD-10-CM

## 2022-02-02 ENCOUNTER — Other Ambulatory Visit: Payer: Self-pay | Admitting: Physician Assistant

## 2022-02-02 DIAGNOSIS — Z1231 Encounter for screening mammogram for malignant neoplasm of breast: Secondary | ICD-10-CM

## 2022-03-19 ENCOUNTER — Ambulatory Visit
Admission: RE | Admit: 2022-03-19 | Discharge: 2022-03-19 | Disposition: A | Discharge status: Home / Self Care | Payer: Medicaid Other | Source: Ambulatory Visit | Attending: Physician Assistant | Admitting: Physician Assistant

## 2022-03-19 DIAGNOSIS — Z1231 Encounter for screening mammogram for malignant neoplasm of breast: Secondary | ICD-10-CM

## 2022-09-16 ENCOUNTER — Telehealth: Payer: Self-pay | Admitting: Emergency Medicine

## 2022-09-16 NOTE — Telephone Encounter (Signed)
Attempted RC to patient. LVM 

## 2022-11-05 ENCOUNTER — Ambulatory Visit: Payer: Medicaid Other | Admitting: Obstetrics & Gynecology

## 2022-11-05 ENCOUNTER — Other Ambulatory Visit (HOSPITAL_COMMUNITY)
Admission: RE | Admit: 2022-11-05 | Discharge: 2022-11-05 | Disposition: A | Discharge status: Home / Self Care | Payer: Medicaid Other | Source: Ambulatory Visit | Attending: Obstetrics & Gynecology | Admitting: Obstetrics & Gynecology

## 2022-11-05 ENCOUNTER — Encounter: Payer: Self-pay | Admitting: Obstetrics & Gynecology

## 2022-11-05 VITALS — BP 138/83 | HR 72 | Ht 64.0 in | Wt 171.0 lb

## 2022-11-05 DIAGNOSIS — Z8742 Personal history of other diseases of the female genital tract: Secondary | ICD-10-CM

## 2022-11-05 DIAGNOSIS — Z01419 Encounter for gynecological examination (general) (routine) without abnormal findings: Secondary | ICD-10-CM

## 2022-11-05 DIAGNOSIS — N39498 Other specified urinary incontinence: Secondary | ICD-10-CM | POA: Diagnosis not present

## 2022-11-05 DIAGNOSIS — Z1211 Encounter for screening for malignant neoplasm of colon: Secondary | ICD-10-CM

## 2022-11-05 DIAGNOSIS — Z7689 Persons encountering health services in other specified circumstances: Secondary | ICD-10-CM | POA: Diagnosis not present

## 2022-11-05 DIAGNOSIS — N3000 Acute cystitis without hematuria: Secondary | ICD-10-CM

## 2022-11-05 DIAGNOSIS — R102 Pelvic and perineal pain: Secondary | ICD-10-CM | POA: Diagnosis not present

## 2022-11-05 LAB — POCT URINALYSIS DIPSTICK
Bilirubin, UA: NEGATIVE
Blood, UA: NEGATIVE
Glucose, UA: POSITIVE — AB
Ketones, UA: NEGATIVE
Leukocytes, UA: NEGATIVE
Nitrite, UA: NEGATIVE
Protein, UA: NEGATIVE
Spec Grav, UA: 1.015 (ref 1.010–1.025)
Urobilinogen, UA: 0.2 E.U./dL
pH, UA: 6 (ref 5.0–8.0)

## 2022-11-05 MED ORDER — NORGESTIM-ETH ESTRAD TRIPHASIC 0.18/0.215/0.25 MG-25 MCG PO TABS: 1.0000 | ORAL_TABLET | Freq: Every day | ORAL | 5 refills | Status: AC

## 2022-11-05 NOTE — Progress Notes (Addendum)
GYNECOLOGY ANNUAL PREVENTATIVE CARE ENCOUNTER NOTE  History:     Krystal Duran is a 52 y.o. [id number] female here for a routine annual gynecologic exam.  Current complaints: periodic left sided lower abdominal/pelvic pain for the last two years. History of bilateral ovarian cysts in 2018, but CT scan of abdomen and pelvis done in [facility] in 03/2022 showed normal adnexa.  She is still on OCPs, still having periods.  Worried about recurrence of cysts, wants ultrasound.  Also wants refill of OCPs, is on Tri-Lo-Estrylla.  Also has some mild urinary leaking sometimes, no dysuria.   Denies abnormal vaginal bleeding, discharge or other gynecologic concerns.    Gynecologic History Patient's last menstrual period was 10/23/2022. Contraception: OCP (estrogen/progesterone) and tubal ligation Last Pap: 11/13/2019. Result was normal with negative HPV Last Mammogram: 03/19/2022.  Result was normal Last Colonoscopy: never had one  Obstetric History OB History  Gravida Para Term Preterm AB Living  5 5 5  0 0 5  SAB IAB Ectopic Multiple Live Births  0 0 0 0      # Outcome Date GA Lbr Len/2nd Weight Sex Delivery Anes PTL Lv  5 Term 2010          4 Term 2009          3 Term 66          2 Term 8          1 Term 21            Obstetric Comments  Gestational HTN-2009 and 2010  Gestational diabetes- 2010    Past Medical History:  Diagnosis Date   Anemia 07/10/2013   Anemia 07/10/2013   Arthritis 2005 and 2009   Pregnancy induced    Breast pain in female 12/19/2010   Costochondritis 09/11/2011   Fatigue 12/19/2010   GERD (gastroesophageal reflux disease) 08/29/2014   Gestational diabetes 2009   Had to take oral hypoglycemic    HLD (hyperlipidemia) 07/04/2014   Hypertension 2009    Gestational HTN, had to take medication in 2009   Peripheral vascular disease (HCC)    varicose veins   Tobacco abuse     Past Surgical History:  Procedure Laterality Date   LASIK  2009   TUBAL  LIGATION  2000    Current Outpatient Medications on File Prior to Visit  Medication Sig Dispense Refill   atorvastatin (LIPITOR) 40 MG tablet Take 1 tablet (40 mg total) by mouth daily. 90 tablet 3   dapagliflozin propanediol (FARXIGA) 5 MG TABS tablet Take by mouth daily.     ergocalciferol (VITAMIN D2) 1.25 MG (50000 UT) capsule Take 50,000 Units by mouth once a week.     glipiZIDE (GLUCOTROL) 5 MG tablet Take 5 mg by mouth daily.     losartan (COZAAR) 25 MG tablet Take 25 mg by mouth daily.     magnesium 30 MG tablet Take 30 mg by mouth 2 (two) times daily.     Omega-3 Fatty Acids (FISH OIL) 600 MG CAPS Take 1 capsule by mouth 4 (four) times daily.      aspirin EC 81 MG tablet Take 1 tablet (81 mg total) by mouth daily. (Patient not taking: Reported on 04/20/2016)     aspirin-acetaminophen-caffeine (EXCEDRIN MIGRAINE) 250-250-65 MG tablet Take 2 tablets by mouth every 6 (six) hours as needed for headache.     Elastic Bandages & Supports (TENNIS ELBOW STRAP) MISC 1 Device by Does not apply route daily. 1  each 0   ferrous sulfate 325 (65 FE) MG tablet Take 325 mg by mouth daily with breakfast.      glipiZIDE (GLUCOTROL) 10 MG tablet TAKE ONE TABLET BY MOUTH TWICE DAILY BEFORE MEAL(S) (Patient not taking: Reported on 04/20/2016) 180 tablet 0   HYDROcodone-acetaminophen (NORCO/VICODIN) 5-325 MG tablet Take 1 tablet by mouth every 6 (six) hours as needed. (Patient not taking: Reported on 04/20/2016) 6 tablet 0   omeprazole (PRILOSEC) 20 MG capsule Take 1 capsule (20 mg total) by mouth daily. (Patient not taking: Reported on 04/20/2016) 30 capsule 3   No current facility-administered medications on file prior to visit.    Allergies  Allergen Reactions   Metformin And Related Other (See Comments)    GI upset    Motrin [Ibuprofen] Swelling    Facial swelling    Social History:  reports that she has been smoking cigarettes and cigars. She has a 0.20 pack-year smoking history. She has never  used smokeless tobacco. She reports that she does not drink alcohol and does not use drugs.  Family History  Problem Relation Age of Onset   Hypertension Mother    Hyperlipidemia Mother    Arthritis Mother        knees   Hyperlipidemia Father    Stroke Father 14   Hyperlipidemia Brother    Hypertension Maternal Aunt    Breast cancer Sister    Arthritis Sister        knees   Migraines Sister    Migraines Sister    Cancer Cousin 3       Vaginal cancer   Breast cancer Cousin     The following portions of the patient's history were reviewed and updated as appropriate: allergies, current medications, past family history, past medical history, past social history, past surgical history and problem list.  Review of Systems Pertinent items noted in HPI and remainder of comprehensive ROS otherwise negative.  Physical Exam:  BP 138/83   Pulse 72   Ht 5\' 4"  (1.626 m)   Wt 171 lb (77.6 kg)   LMP 10/23/2022   BMI 29.35 kg/m  CONSTITUTIONAL: Well-developed, well-nourished female in no acute distress.  HENT:  Normocephalic, atraumatic, External right and left ear normal.  EYES: Conjunctivae and EOM are normal. Pupils are equal, round, and reactive to light. No scleral icterus.  NECK: Normal range of motion, supple, no masses.  Normal thyroid.  SKIN: Skin is warm and dry. No rash noted. Not diaphoretic. No erythema. No pallor. MUSCULOSKELETAL: Normal range of motion. No tenderness.  No cyanosis, clubbing, or edema. NEUROLOGIC: Alert and oriented to person, place, and time. Normal reflexes, muscle tone coordination.  PSYCHIATRIC: Normal mood and affect. Normal behavior. Normal judgment and thought content. CARDIOVASCULAR: Normal heart rate noted, regular rhythm RESPIRATORY: Clear to auscultation bilaterally. Effort and breath sounds normal, no problems with respiration noted. BREASTS: Symmetric in size. No masses, tenderness, skin changes, nipple drainage, or lymphadenopathy bilaterally.  Performed in the presence of a chaperone. ABDOMEN: Soft, no distention noted.  No tenderness, rebound or guarding.  PELVIC: Normal appearing external genitalia and urethral meatus; normal appearing vaginal mucosa and cervix.  Grade 1 midline cystocele and rectocele seen on exam. No abnormal vaginal discharge noted.  Pap smear obtained.  Normal uterine size, no other palpable masses, no uterine or adnexal tenderness.  Performed in the presence of a chaperone.  Results for orders placed or performed in visit on 11/05/22 (from the past 24 hour(s))  POCT urinalysis  dipstick     Status: Abnormal   Collection Time: 11/05/22  3:30 PM  Result Value Ref Range   Color, UA yellow    Clarity, UA clear    Glucose, UA Positive (A) Negative   Bilirubin, UA negative    Ketones, UA negative    Spec Grav, UA 1.015 1.010 - 1.025   Blood, UA negative    pH, UA 6.0 5.0 - 8.0   Protein, UA Negative Negative   Urobilinogen, UA 0.2 0.2 or 1.0 E.U./dL   Nitrite, UA negative    Leukocytes, UA Negative Negative   Appearance     Odor       Assessment and Plan:     1. History of ovarian cyst 2. Pelvic pain Unsure etiology, but will get ultrasound. Will follow up results and manage accordingly. - US PELVIC COMPLETE WITH TRANSVAGINAL; Future  3. Menstrual regulation OCPs refilled. - Norgestimate-Ethinyl Estradiol Triphasic (TRI-LO-ESTARYLLA) 0.18/0.215/0.25 MG-25 MCG tab; Take 1 tablet by mouth daily.  Dispense: 84 tablet; Refill: 5  4. Urine incontinence Recommended referral to Urogynecology, she wants to defer for now. No signs of overt infection. - POCT urinalysis dipstick negative - Urine Culture sent  5. Colon cancer screening Discussed need for colon cancer screening over the age 12. Colonoscopy recommended as this is the gold standard. Referral made to Gastroenterology for colonoscopy - Ambulatory referral to Gastroenterology  6. Well woman exam with routine gynecological exam - Cytology - PAP(  [place]) Will follow up results of pap smear and manage accordingly. Mammogram is up to date. Routine preventative health maintenance measures emphasized. Please refer to After Visit Summary for other counseling recommendations.      Jaynie Collins, MD, FACOG Obstetrician & Gynecologist, [facility] for [employer], [facility]

## 2022-11-06 LAB — CYTOLOGY - PAP
Comment: NEGATIVE
Diagnosis: NEGATIVE
High risk HPV: NEGATIVE

## 2022-11-10 LAB — URINE CULTURE

## 2022-11-10 MED ORDER — CEFADROXIL 500 MG PO CAPS: 500.0000 mg | ORAL_CAPSULE | Freq: Two times a day (BID) | ORAL | 0 refills | Status: AC

## 2022-11-10 NOTE — Addendum Note (Signed)
Addended by: Jaynie Collins A on: 11/10/2022 08:08 AM   Modules accepted: Orders

## 2022-11-10 NOTE — Progress Notes (Signed)
TC. No answer. Left HIPAA compliant VM with call back number. Pt does not have [facility] access.

## 2022-11-13 ENCOUNTER — Ambulatory Visit (HOSPITAL_BASED_OUTPATIENT_CLINIC_OR_DEPARTMENT_OTHER)
Admission: RE | Admit: 2022-11-13 | Discharge: 2022-11-13 | Disposition: A | Discharge status: Home / Self Care | Payer: Medicaid Other | Source: Ambulatory Visit | Attending: Obstetrics & Gynecology | Admitting: Obstetrics & Gynecology

## 2022-11-13 DIAGNOSIS — R102 Pelvic and perineal pain: Secondary | ICD-10-CM | POA: Diagnosis present

## 2022-11-13 DIAGNOSIS — Z8742 Personal history of other diseases of the female genital tract: Secondary | ICD-10-CM | POA: Diagnosis present

## 2022-11-19 ENCOUNTER — Other Ambulatory Visit: Payer: Self-pay | Admitting: Physician Assistant

## 2022-11-19 DIAGNOSIS — Z1231 Encounter for screening mammogram for malignant neoplasm of breast: Secondary | ICD-10-CM

## 2023-03-22 ENCOUNTER — Ambulatory Visit
Admission: RE | Admit: 2023-03-22 | Discharge: 2023-03-22 | Disposition: A | Discharge status: Home / Self Care | Payer: Medicaid Other | Source: Ambulatory Visit | Attending: Physician Assistant | Admitting: Physician Assistant

## 2023-03-22 DIAGNOSIS — Z1231 Encounter for screening mammogram for malignant neoplasm of breast: Secondary | ICD-10-CM

## 2024-02-17 ENCOUNTER — Other Ambulatory Visit: Payer: Self-pay | Admitting: Physician Assistant

## 2024-02-17 DIAGNOSIS — Z1231 Encounter for screening mammogram for malignant neoplasm of breast: Secondary | ICD-10-CM

## 2024-03-23 ENCOUNTER — Ambulatory Visit
Admission: RE | Admit: 2024-03-23 | Discharge: 2024-03-23 | Disposition: A | Discharge status: Home / Self Care | Source: Ambulatory Visit | Attending: Physician Assistant | Admitting: Physician Assistant

## 2024-03-23 DIAGNOSIS — Z1231 Encounter for screening mammogram for malignant neoplasm of breast: Secondary | ICD-10-CM
